# Patient Record
Sex: Female | Born: 1985 | Race: White | Hispanic: No | Marital: Single | Smoking: Never smoker
Health system: Southern US, Community
[De-identification: ages and names within clinical notes are randomized; demographics above are authoritative.]

## PROBLEM LIST (undated history)

## (undated) DIAGNOSIS — E669 Obesity, unspecified: Secondary | ICD-10-CM

## (undated) DIAGNOSIS — I160 Hypertensive urgency: Secondary | ICD-10-CM

## (undated) DIAGNOSIS — F419 Anxiety disorder, unspecified: Secondary | ICD-10-CM

## (undated) DIAGNOSIS — I517 Cardiomegaly: Secondary | ICD-10-CM

## (undated) DIAGNOSIS — K219 Gastro-esophageal reflux disease without esophagitis: Secondary | ICD-10-CM

## (undated) DIAGNOSIS — J329 Chronic sinusitis, unspecified: Secondary | ICD-10-CM

## (undated) HISTORY — DX: Gastro-esophageal reflux disease without esophagitis: K21.9

## (undated) HISTORY — DX: Chronic sinusitis, unspecified: J32.9

## (undated) HISTORY — DX: Obesity, unspecified: E66.9

## (undated) HISTORY — DX: Anxiety disorder, unspecified: F41.9

## (undated) HISTORY — DX: Hypertensive urgency: I16.0

## (undated) HISTORY — DX: Cardiomegaly: I51.7

---
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
## 2010-07-27 ENCOUNTER — Emergency Department (HOSPITAL_COMMUNITY)
Admission: EM | Admit: 2010-07-27 | Discharge: 2010-07-27 | Payer: Self-pay | Source: Home / Self Care | Admitting: Emergency Medicine

## 2013-08-27 HISTORY — PX: TUBAL LIGATION: SHX77

## 2013-10-16 ENCOUNTER — Other Ambulatory Visit (HOSPITAL_COMMUNITY): Payer: Self-pay | Admitting: Obstetrics and Gynecology

## 2013-10-16 ENCOUNTER — Encounter (HOSPITAL_COMMUNITY): Payer: Self-pay | Admitting: Obstetrics and Gynecology

## 2013-10-16 DIAGNOSIS — Z3689 Encounter for other specified antenatal screening: Secondary | ICD-10-CM

## 2013-10-16 DIAGNOSIS — O280 Abnormal hematological finding on antenatal screening of mother: Secondary | ICD-10-CM

## 2013-10-23 ENCOUNTER — Encounter (HOSPITAL_COMMUNITY): Payer: BC Managed Care – PPO

## 2013-10-23 ENCOUNTER — Ambulatory Visit (HOSPITAL_COMMUNITY): Admission: RE | Admit: 2013-10-23 | Payer: BC Managed Care – PPO | Source: Ambulatory Visit

## 2013-10-28 ENCOUNTER — Ambulatory Visit (HOSPITAL_COMMUNITY): Payer: BC Managed Care – PPO | Attending: Obstetrics and Gynecology

## 2013-10-28 ENCOUNTER — Ambulatory Visit (HOSPITAL_COMMUNITY): Admission: RE | Admit: 2013-10-28 | Payer: BC Managed Care – PPO | Source: Ambulatory Visit

## 2018-07-08 DIAGNOSIS — I361 Nonrheumatic tricuspid (valve) insufficiency: Secondary | ICD-10-CM

## 2018-07-08 DIAGNOSIS — R0602 Shortness of breath: Secondary | ICD-10-CM

## 2018-07-08 DIAGNOSIS — I5020 Unspecified systolic (congestive) heart failure: Secondary | ICD-10-CM

## 2018-07-08 DIAGNOSIS — J329 Chronic sinusitis, unspecified: Secondary | ICD-10-CM

## 2018-07-08 DIAGNOSIS — I10 Essential (primary) hypertension: Secondary | ICD-10-CM

## 2018-07-08 DIAGNOSIS — I5043 Acute on chronic combined systolic (congestive) and diastolic (congestive) heart failure: Secondary | ICD-10-CM

## 2018-07-14 ENCOUNTER — Telehealth: Payer: Self-pay

## 2018-07-14 NOTE — Telephone Encounter (Signed)
SENT REFERRAL TO SCHEDULING AND FILE NOTES 

## 2018-07-22 ENCOUNTER — Encounter: Payer: Self-pay | Admitting: Internal Medicine

## 2018-07-22 ENCOUNTER — Ambulatory Visit (INDEPENDENT_AMBULATORY_CARE_PROVIDER_SITE_OTHER): Payer: Self-pay | Admitting: Internal Medicine

## 2018-07-22 VITALS — BP 110/82 | HR 63 | Ht 66.0 in | Wt 277.0 lb

## 2018-07-22 DIAGNOSIS — I428 Other cardiomyopathies: Secondary | ICD-10-CM

## 2018-07-22 MED ORDER — SACUBITRIL-VALSARTAN 49-51 MG PO TABS: 1.0000 | ORAL_TABLET | Freq: Two times a day (BID) | ORAL | 11 refills | Status: DC

## 2018-07-22 NOTE — Progress Notes (Signed)
ELECTROPHYSIOLOGY OFFICE NOTE  Patient ID: Joy Diaz, MRN: [id number], DOB/AGE: May 13, 1986 32 y.o. Admit date: (Not on file) Date of Consult: 07/22/2018  Primary Physician: Patient, [name] Primary Cardiologist: new     HPI Joy Diaz is a 32 y.o. female seen at her own request.  She has a history of morbid obesity and her patient record describes bipolar disorder.  She has a history of allergic diathesis and has had recurrent sinus infections.  She noted problems again about a month ago further accompanied by dyspnea.  She had a little bit of peripheral edema.  She went to primary care where chest x-ray demonstrated cardiomegaly.  She was referred to [facility].  Echocardiogram demonstrated an ejection fraction of 15% with LVIDD of 6.1 severe left atrial enlargement at 50.4 mm right side was described as mildly enlarged but preserved RV function.  She underwent a nuclear medicine scan with a resting systolic blood pressure 162/100.  Nuclear images were obtained.  According to the family they were negative.  They are not available.  She has a history of blood pressure.  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  Over the years when she has been noted to be hypertensive after coming down apparently her blood pressures returned to normal suggesting whitecoat hypertension.  Not withstanding, the discharge summary from [facility] hospital describes hypertensive urgency.  Having been admitted to the hospital she was treated with IV Lasix and then started on carvedilol 12.5 valsartan 160 spironolactone.  The latter was discontinued because of vaginal bleeding.  She will try to begin following resolution with recurrence of bleeding.  She has poor understanding of her situation.  She gave birth to a son 4 years ago; no interval pregnancies.  No recent viral illnesses.  Troponins x1 were normal at [facility]       Past Medical History:  Diagnosis  Date  . Anxiety   . Enlarged heart   . GERD (gastroesophageal reflux disease)   . Hypertensive urgency   . Obese   . Sinusitis       Surgical History:  Past Surgical History:  Procedure Laterality Date  . MANDIBLE SURGERY Bilateral 2006  . TUBAL LIGATION Bilateral 2015     Home Meds: Current Meds  Medication Sig  . aspirin EC 81 MG tablet Take 81 mg by mouth daily.  . carvedilol (COREG) 25 MG tablet Take 25 mg by mouth 2 (two) times daily with a meal.  . furosemide (LASIX) 40 MG tablet Take 40 mg by mouth.  Marland Kitchen omeprazole (PRILOSEC) 10 MG capsule Take 10 mg by mouth daily.  Marland Kitchen spironolactone (ALDACTONE) 25 MG tablet Take 25 mg by mouth daily.  . valsartan (DIOVAN) 160 MG tablet Take 160 mg by mouth daily.    Allergies: No Known Allergies  Social History   Socioeconomic History  . Marital status: Single    Spouse name: Not on file  . Number of children: Not on file  . Years of education: Not on file  . Highest education level: Not on file  Occupational History  . Not on file  Social Needs  . Financial resource strain: Not on file  . Food insecurity:    Worry: Not on file    Inability: Not on file  . Transportation needs:    Medical: Not on file    Non-medical: Not on file  Tobacco Use  . Smoking status: Never Smoker  . Smokeless  tobacco: Never Used  Substance and Sexual Activity  . Alcohol use: Never    Frequency: Never  . Drug use: Never  . Sexual activity: Not on file  Lifestyle  . Physical activity:    Days per week: Not on file    Minutes per session: Not on file  . Stress: Not on file  Relationships  . Social connections:    Talks on phone: Not on file    Gets together: Not on file    Attends religious service: Not on file    Active member of club or organization: Not on file    Attends meetings of clubs or organizations: Not on file    Relationship status: Not on file  . Intimate partner violence:    Fear of current or ex partner: Not on file     Emotionally abused: Not on file    Physically abused: Not on file    Forced sexual activity: Not on file  Other Topics Concern  . Not on file  Social History Narrative  . Not on file     Family History  Problem Relation Age of Onset  . CAD Paternal Grandfather   . Asthma Mother   . Migraines Mother   . Rheum arthritis Mother   . Ulcerative colitis Sister   . Irritable bowel syndrome Brother   . Celiac disease Maternal Grandmother   . Osteopenia Maternal Grandmother   . Heart disease Maternal Grandfather      ROS:  Please see the history of present illness.     All other systems reviewed and negative.    Physical Exam:  Blood pressure 110/82, pulse 63, height 5\' 6"  (1.676 m), weight 277 lb (125.6 kg), SpO2 97 %. General: Well developed, Morbidly obese  female in no acute distress. Head: Normocephalic, atraumatic, sclera non-icteric, no xanthomas, nares are without discharge. EENT: normal  Lymph Nodes:  none Neck: Negative for carotid bruits. JVD 8 Back:without scoliosis kyphosis Lungs: Clear bilaterally to auscultation without wheezes, rales, or rhonchi. Breathing is unlabored. Heart: RRR with S1 S2. No murmur . No rubs, or gallops appreciated. Abdomen: Soft, non-tender, non-distended with normoactive bowel sounds. No hepatomegaly. No rebound/guarding. No obvious abdominal masses. Msk:  Strength and tone appear normal for age. Extremities: No clubbing or cyanosis. TR edema.  Distal pedal pulses are 2+ and equal bilaterally. Skin: Warm and Dry Neuro: Alert and oriented X 3. CN III-XII intact Grossly normal sensory and motor function . Psych:  Responds to questions appropriately with a normal affect.      Labs: Cardiac Enzymes No results for input(s): CKTOTAL, CKMB, TROPONINI in the last 72 hours. CBC No results found for: WBC, HGB, HCT, MCV, PLT PROTIME: No results for input(s): LABPROT, INR in the last 72 hours. Chemistry No results for input(s): NA, K, CL, CO2, BUN,  CREATININE, CALCIUM, PROT, BILITOT, ALKPHOS, ALT, AST, GLUCOSE in the last 168 hours.  Invalid input(s): LABALBU Lipids No results found for: CHOL, HDL, LDLCALC, TRIG BNP No results found for: PROBNP Thyroid Function Tests: No results for input(s): TSH, T4TOTAL, T3FREE, THYROIDAB in the last 72 hours.  Invalid input(s): FREET3 Miscellaneous No results found for: DDIMER  Radiology/Studies:  No results found.  EKG: ECG from November 12  12: 26 hours sinus rhythm at 102 Intervals 18/09/36 2201 sinus rhythm at 92 intervals 20/11/42 Notably P wave morphology is similar in both.  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  Her symptoms are surprisingly few.  I do not think that her rhythm is responsible.  It looks too much like sinus rhythm.  No antecedent symptoms to suggest myocarditis; her troponin was also normal.  However, imaging is going to be essential.  We will plan to repeat the echo at the time of heart failure clinic appointment and will schedule her for a cardiac MRI.  We will decrease her carvedilol given her lightheadedness from 25--12.5.  We will discontinue her valsartan and put her on Entresto 49/51.  It looks like she will be intolerant of aldosterone antagonists because of the vaginal bleeding.  We have reviewed the physiology and the potential implications of her severe LV dysfunction. I have reviewed the above plan with Dr. Sherlie Ban.  He is in agreement.  We will obtain a repeat echo       Sherryl Manges

## 2018-07-22 NOTE — Patient Instructions (Addendum)
Medication Instructions:  Your physician has recommended you make the following change in your medication:   1. Stop Valsartan 2. Stop Spironolactone 3. Decrease your Carvedilol to 12.5mg , half tablet, two times per day 4. Begin Entresto 49/51 tablet, two times per day   Labwork: Your physician recommends that you return for lab work in:   2 weeks for a BMP   Testing/Procedures: Your physician has requested that you have a cardiac MRI. Cardiac MRI uses a computer to create images of your heart as its beating, producing both still and moving pictures of your heart and major blood vessels. For further information please visit [email or web address]. Please follow the instruction sheet given to you today for more information.   Follow-Up: Your physician recommends that you schedule a follow-up appointment in:   We have sent in a referral to Dr Shirlee Latch in our Heart Failure Clinic  You will follow up with Dr Graciela Husbands as needed   Any Other Special Instructions Will Be Listed Below (If Applicable).     If you need a refill on your cardiac medications before your next appointment, please call your pharmacy.

## 2018-07-28 ENCOUNTER — Other Ambulatory Visit: Payer: Self-pay | Admitting: *Deleted

## 2018-07-28 ENCOUNTER — Other Ambulatory Visit (HOSPITAL_COMMUNITY): Payer: Self-pay | Admitting: *Deleted

## 2018-07-28 DIAGNOSIS — I5022 Chronic systolic (congestive) heart failure: Secondary | ICD-10-CM

## 2018-07-28 MED ORDER — CARVEDILOL 25 MG PO TABS: 12.5000 mg | ORAL_TABLET | Freq: Two times a day (BID) | ORAL | 3 refills | Status: DC

## 2018-07-29 ENCOUNTER — Telehealth (HOSPITAL_COMMUNITY): Payer: Self-pay | Admitting: Licensed Clinical Social Worker

## 2018-07-29 NOTE — Telephone Encounter (Signed)
CSW received referral to assist patient with financial resources and navigating the healthcare system for insurance options. CSW contacted patient who reports she and her child reside with a roommate. She reports she gave her last check from employment to her roommate to cover the rent expenses and "that is about to run out". She reports she has food stamps and due to a custody agreement where she agreed to not accept child support due to joint custody of the child she is not eligible to apply for Medicaid.  CSW discussed options from Social Security Disability to [employer]. Patient will follow up with [employer] and attempt to obtain an appointment for application. Patient will be seen in the HF Clinic on Friday August 01, 2018 and CSW will follow up at that time. Patient verbalizes understanding and will see CSW on Friday.  Joy Beech, LCSW, CCSW-MCS [phone]

## 2018-07-30 ENCOUNTER — Telehealth: Payer: Self-pay

## 2018-07-30 NOTE — Telephone Encounter (Signed)
The pt left her [employer] Pt Asst application here yesterday. I have completed the provider part of the application and placed it in Dr Koren Bound mail bin awaiting his signature.

## 2018-07-31 NOTE — Progress Notes (Signed)
Advanced Heart Failure Clinic Note   Referring Physician: PCP: Patient, [name] PCP-Cardiologist: No primary care provider on file.   HPI:  Joy Diaz is a 32 y.o. female with chronic systolic CHF, morbid obesity, anxiety, cardiomegaly, HTN (with ? Component of white coat hypertension), and GERD.   Seen at [facility] hospital earlier this year and Echo at that time shoed EF 15% with LVIDD 6.1, severe left atrial enlargement, and preserved RV function.  NM scan done at that time with resting BP of 162/100. Per family, scan was negative. They are not available for review.   Seen by Dr. Graciela Husbands 07/22/18 after complaints of dyspnea and peripheral edema. Transitioned to [facility] and discussed with Dr. Shirlee Latch who recommended referral to [facility] clinic.   Echo today shows EF ~ 35% per Dr. Shirlee Latch. Significantly improved from reports from [place]. Formal read pending.  Review of systems complete and found to be negative unless listed in HPI.    Past Medical History 1. Chronic systolic CHF - Echo today shows EF ~ 35% per Dr. Shirlee Latch. Significantly improved from reports from [place] of EF 15%.(though formal read) - NM scan at [facility] negative. Presumed NICM based on this.  - Will order cMRI to look for infiltrative CMP vs myocarditis.  2. HTN 3. H/o orthostasis 4. Morbid obesity - Body mass index is 44.84 kg/m.  - Encouraged weight loss through diet and exercise.   Past Medical History:  Diagnosis Date  . Anxiety   . Enlarged heart   . GERD (gastroesophageal reflux disease)   . Hypertensive urgency   . Obese   . Sinusitis     Current Outpatient Medications  Medication Sig Dispense Refill  . carvedilol (COREG) 25 MG tablet Take 0.5 tablets (12.5 mg total) by mouth 2 (two) times daily with a meal. 90 tablet 3  . furosemide (LASIX) 40 MG tablet Take 40 mg by mouth.    . sacubitril-valsartan (ENTRESTO) 49-51 MG Take 1 tablet by mouth 2 (two) times daily. 60 tablet 11  .  omeprazole (PRILOSEC) 10 MG capsule Take 10 mg by mouth daily.     No current facility-administered medications for this encounter.    Allergies  Allergen Reactions  . Spironolactone Other (See Comments)    Vaginal bleeding    Social History   Socioeconomic History  . Marital status: Single    Spouse name: Not on file  . Number of children: Not on file  . Years of education: Not on file  . Highest education level: Not on file  Occupational History  . Not on file  Social Needs  . Financial resource strain: Not on file  . Food insecurity:    Worry: Not on file    Inability: Not on file  . Transportation needs:    Medical: Not on file    Non-medical: Not on file  Tobacco Use  . Smoking status: Never Smoker  . Smokeless tobacco: Never Used  Substance and Sexual Activity  . Alcohol use: Never    Frequency: Never  . Drug use: Never  . Sexual activity: Not on file  Lifestyle  . Physical activity:    Days per week: Not on file    Minutes per session: Not on file  . Stress: Not on file  Relationships  . Social connections:    Talks on phone: Not on file    Gets together: Not on file    Attends religious service: Not on file    Active  member of club or organization: Not on file    Attends meetings of clubs or organizations: Not on file    Relationship status: Not on file  . Intimate partner violence:    Fear of current or ex partner: Not on file    Emotionally abused: Not on file    Physically abused: Not on file    Forced sexual activity: Not on file  Other Topics Concern  . Not on file  Social History Narrative  . Not on file      Family History  Problem Relation Age of Onset  . CAD Paternal Grandfather   . Asthma Mother   . Migraines Mother   . Rheum arthritis Mother   . Ulcerative colitis Sister   . Irritable bowel syndrome Brother   . Celiac disease Maternal Grandmother   . Osteopenia Maternal Grandmother   . Heart disease Maternal Grandfather      Vitals:   08/01/18 1104  BP: 100/70  Pulse: 68  SpO2: 97%  Weight: 126 kg (277 lb 12.8 oz)    Wt Readings from Last 3 Encounters:  08/01/18 126 kg (277 lb 12.8 oz)  07/22/18 125.6 kg (277 lb)    PHYSICAL EXAM: General:  Well appearing. No respiratory difficulty HEENT: normal Neck: supple. no JVD. Carotids 2+ bilat; no bruits. No lymphadenopathy or thyromegaly appreciated. Cor: PMI nondisplaced. Regular rate & rhythm. No rubs, gallops or murmurs. Lungs: clear Abdomen: Obese, soft, nontender, nondistended. No hepatosplenomegaly. No bruits or masses. Good bowel sounds. Extremities: no cyanosis, clubbing, rash, edema Neuro: alert & oriented x 3, cranial nerves grossly intact. moves all 4 extremities w/o difficulty. Affect pleasant.  ECG: NSR 71 bpm, personally reviewed.  ASSESSMENT & PLAN:  1. Chronic systolic CHF - Echo today shows EF ~ 35% per Dr. Shirlee Latch. Significantly improved from reports from [place]. Formal read pending. - Will order cMRI to look for infiltrative CMP vs myocarditis. SW to see today to help with finances - NYHA II symptoms currently. - Volume status stable to dry with mild orthostatics. - Cut lasix back to 40 mg Monday and Friday. Can take additional as needed. BMET today.  - Continue Entresto 49/51 mg BID - Continue coreg 12.5 mg BID - Reinforced fluid restriction to < 2 L daily, sodium restriction to less than 2000 mg daily, and the importance of daily weights.   2. HTN - Will adjust meds in setting of treating her CHF. Likely large contributor to her CHF. No room to up-titrate meds today.  3. H/o orthostasis - Mild orthostasis today. Cutting back lasix as above.  4. Morbid obesity - Body mass index is 44.84 kg/m. - Encouraged weight loss through diet and exercise.   Will schedule cMRI and have SW see to if she can manage to pay for, or if we can get her in [facility] discount program.   Graciella Freer, PA-C 08/01/18   Greater than 50% of  the 30 minute visit was spent in counseling/coordination of care regarding disease state education, salt/fluid restriction, sliding scale diuretics, and medication compliance.

## 2018-08-01 ENCOUNTER — Other Ambulatory Visit: Payer: Self-pay

## 2018-08-01 ENCOUNTER — Encounter (HOSPITAL_COMMUNITY): Payer: Self-pay

## 2018-08-01 ENCOUNTER — Ambulatory Visit (HOSPITAL_BASED_OUTPATIENT_CLINIC_OR_DEPARTMENT_OTHER)
Admission: RE | Admit: 2018-08-01 | Discharge: 2018-08-01 | Disposition: A | Discharge status: Home / Self Care | Payer: Self-pay | Source: Ambulatory Visit

## 2018-08-01 ENCOUNTER — Ambulatory Visit (HOSPITAL_COMMUNITY)
Admission: RE | Admit: 2018-08-01 | Discharge: 2018-08-01 | Disposition: A | Discharge status: Home / Self Care | Payer: Self-pay | Source: Ambulatory Visit | Attending: Internal Medicine | Admitting: Internal Medicine

## 2018-08-01 VITALS — BP 100/70 | HR 68 | Wt 277.8 lb

## 2018-08-01 DIAGNOSIS — Z79899 Other long term (current) drug therapy: Secondary | ICD-10-CM | POA: Insufficient documentation

## 2018-08-01 DIAGNOSIS — I10 Essential (primary) hypertension: Secondary | ICD-10-CM

## 2018-08-01 DIAGNOSIS — K219 Gastro-esophageal reflux disease without esophagitis: Secondary | ICD-10-CM | POA: Insufficient documentation

## 2018-08-01 DIAGNOSIS — I371 Nonrheumatic pulmonary valve insufficiency: Secondary | ICD-10-CM | POA: Insufficient documentation

## 2018-08-01 DIAGNOSIS — Z888 Allergy status to other drugs, medicaments and biological substances status: Secondary | ICD-10-CM | POA: Insufficient documentation

## 2018-08-01 DIAGNOSIS — I5022 Chronic systolic (congestive) heart failure: Secondary | ICD-10-CM

## 2018-08-01 DIAGNOSIS — I110 Hypertensive heart disease with heart failure: Secondary | ICD-10-CM | POA: Insufficient documentation

## 2018-08-01 DIAGNOSIS — I951 Orthostatic hypotension: Secondary | ICD-10-CM

## 2018-08-01 DIAGNOSIS — Z6841 Body Mass Index (BMI) 40.0 and over, adult: Secondary | ICD-10-CM | POA: Insufficient documentation

## 2018-08-01 DIAGNOSIS — Z8249 Family history of ischemic heart disease and other diseases of the circulatory system: Secondary | ICD-10-CM | POA: Insufficient documentation

## 2018-08-01 LAB — BASIC METABOLIC PANEL
ANION GAP: 10 (ref 5–15)
BUN: 14 mg/dL (ref 6–20)
CHLORIDE: 101 mmol/L (ref 98–111)
CO2: 26 mmol/L (ref 22–32)
CREATININE: 1.44 mg/dL — AB (ref 0.44–1.00)
Calcium: 9.5 mg/dL (ref 8.9–10.3)
GFR calc non Af Amer: 48 mL/min — ABNORMAL LOW (ref >60)
GFR, EST AFRICAN AMERICAN: 56 mL/min — AB (ref >60)
Glucose, Bld: 119 mg/dL — ABNORMAL HIGH (ref 70–99)
Potassium: 4.4 mmol/L (ref 3.5–5.1)
Sodium: 137 mmol/L (ref 135–145)

## 2018-08-01 MED ORDER — FUROSEMIDE 40 MG PO TABS: ORAL_TABLET | ORAL | 2 refills | Status: DC

## 2018-08-01 NOTE — Patient Instructions (Signed)
Labs done today  Your physician has requested that you have a cardiac MRI. Cardiac MRI uses a computer to create images of your heart as its beating, producing both still and moving pictures of your heart and major blood vessels. For further information please visit [email or web address]. Please follow the instruction sheet given to you today for more information.  DECREASE Furosemide to 40mg  (1 tab) Mondays and Fridays. You may take an additional 1 tab as needed.  Follow up with Dr. Shirlee Latch in 3-4 weeks.

## 2018-08-01 NOTE — Addendum Note (Signed)
Encounter addended by: Marcy Siren, LCSW on: 08/01/2018 3:08 PM  Actions taken: Sign clinical note

## 2018-08-01 NOTE — Progress Notes (Signed)
CSW met with patient to discuss options for insurance. Patient has no income and no insurance. She thinks she may have applied for the [facility] but is unsure. CSW discussed follow up with medicaid office for possible application for disability medicaid or orange card eligibility. Patient provided numbers for Financial Counseling as well as [employer] [facility] for PCP/Financial Counseling as well. Patient grateful for the assistance and will return call if further needs arise. Raquel Sarna, [place], [place]

## 2018-08-01 NOTE — Progress Notes (Signed)
  Echocardiogram 2D Echocardiogram has been performed.  Joy Diaz 08/01/2018, 11:06 AM

## 2018-08-04 NOTE — Telephone Encounter (Signed)
**Note De-identified Joy Diaz Obfuscation** Dr Klein has signed the pts [employer] pt asst application and I have faxed it to [employer]. 

## 2018-08-12 NOTE — Telephone Encounter (Signed)
Received approval letter from [employer] stating that pt's Sherryll Burger was approved through 08/10/2019 with no out of pocket cost!

## 2018-09-05 ENCOUNTER — Ambulatory Visit (HOSPITAL_COMMUNITY)
Admission: RE | Admit: 2018-09-05 | Discharge: 2018-09-05 | Disposition: A | Discharge status: Home / Self Care | Payer: Self-pay | Source: Ambulatory Visit | Attending: Cardiology | Admitting: Cardiology

## 2018-09-05 ENCOUNTER — Encounter (HOSPITAL_COMMUNITY): Payer: Self-pay | Admitting: Cardiology

## 2018-09-05 VITALS — BP 126/86 | HR 82 | Wt 274.2 lb

## 2018-09-05 DIAGNOSIS — Z79899 Other long term (current) drug therapy: Secondary | ICD-10-CM | POA: Insufficient documentation

## 2018-09-05 DIAGNOSIS — E669 Obesity, unspecified: Secondary | ICD-10-CM | POA: Insufficient documentation

## 2018-09-05 DIAGNOSIS — I429 Cardiomyopathy, unspecified: Secondary | ICD-10-CM

## 2018-09-05 DIAGNOSIS — R05 Cough: Secondary | ICD-10-CM | POA: Insufficient documentation

## 2018-09-05 DIAGNOSIS — R0600 Dyspnea, unspecified: Secondary | ICD-10-CM | POA: Insufficient documentation

## 2018-09-05 DIAGNOSIS — R0981 Nasal congestion: Secondary | ICD-10-CM | POA: Insufficient documentation

## 2018-09-05 DIAGNOSIS — I517 Cardiomegaly: Secondary | ICD-10-CM | POA: Insufficient documentation

## 2018-09-05 DIAGNOSIS — I514 Myocarditis, unspecified: Secondary | ICD-10-CM

## 2018-09-05 DIAGNOSIS — I10 Essential (primary) hypertension: Secondary | ICD-10-CM

## 2018-09-05 DIAGNOSIS — I5022 Chronic systolic (congestive) heart failure: Secondary | ICD-10-CM

## 2018-09-05 LAB — BASIC METABOLIC PANEL
Anion gap: 6 (ref 5–15)
BUN: 10 mg/dL (ref 6–20)
CO2: 29 mmol/L (ref 22–32)
CREATININE: 0.97 mg/dL (ref 0.44–1.00)
Calcium: 9.6 mg/dL (ref 8.9–10.3)
Chloride: 104 mmol/L (ref 98–111)
GFR calc Af Amer: >60 mL/min (ref >60)
GLUCOSE: 118 mg/dL — AB (ref 70–99)
Potassium: 3.8 mmol/L (ref 3.5–5.1)
SODIUM: 139 mmol/L (ref 135–145)

## 2018-09-05 MED ORDER — SACUBITRIL-VALSARTAN 97-103 MG PO TABS: 1.0000 | ORAL_TABLET | Freq: Two times a day (BID) | ORAL | 3 refills | Status: DC

## 2018-09-05 NOTE — Progress Notes (Signed)
CSW consulted to assist patient in getting insurance coverage.  CSW spoke with pt who states she has applied for orange card and for cone assistance.  Pt reports that she was denied for orange card since she is not a [place] resident and that cone will not move forward with her application till she gives them proof of residence which she can't obtain because does not possess the utilities or lease and doesn't have the money to change her driver license to reflect the correct address.  Pt also unable to provide financial documentation due to lack of income over past months.  CSW completed 4506-t non-filing request form to get proof of pt inability to file for taxes last year for her financial documentation and pt to obtain letter of award for food stamps.    CSW to look into options to prove residency and will follow up with pt on Tuesday.   CSW will continue to follow and assist as able  Burna Sis, LCSW Clinical Social Worker Advanced Heart Failure Clinic [phone]

## 2018-09-05 NOTE — Patient Instructions (Signed)
INCREASE Entresto to 97/103mg  twice daily.  Routine lab work today. Will notify you of abnormal results  Repeat labs in 10days at [facility].  Your provider requests you have a Cardiac MRI and a Coronary CTA. (they will contact you to schedule appointments)  Follow up in 3 weeks with pharmacy.  Follow up in 6 weeks with Dr.McLean.

## 2018-09-08 ENCOUNTER — Telehealth (HOSPITAL_COMMUNITY): Payer: Self-pay | Admitting: *Deleted

## 2018-09-08 NOTE — Telephone Encounter (Signed)
Patient does not qualify for the orange card and does not have insurance. Patient said she can not afford to have cardiac CT or CMRI.

## 2018-09-08 NOTE — Progress Notes (Signed)
PCP: Joy Diaz, [name]  Cardiology: Dr. Graciela Husbands HF Cardiology: Dr. Shirlee Latch  32 with cardiomyopathy referred by Dr. Graciela Husbands for evaluation of CHF.   In 10/19, she developed dyspnea.  She thought she had another episode of sinusitis initially (has had episodes repetitively in the past).  She had congestion, cough, and dyspnea. No chest pain. She had a CXR done that showed cardiomegaly.  Therefore, in 11/19, she had an echo done in [place] showing EF 15%, mild LV dilation, normal RV.  She was started on cardiac medications with cardiomyopathy.  She had vaginal bleeding twice with spironolactone so stopped it.  She is tolerating Coreg and Entresto.  Repeat echo in 12/19 showed EF 30-35%, mild LVH.    Currently, feeling better with treatment. She is short of breath only with heavy exertion.  No problems doing work around the house.  No dyspnea walking on flat ground or up 1 flight stairs.  No orthopnea/PND.  No lightheadedness/syncope.   ECG (12/19): NSR, normal (personally reviewed).   Labs (12/19): K 4.4, creatinine 1.44  PMH: 1. Obesity 2. Bipolar disorder 3. Recurrent sinusitis 4. HTN: Also with pre-eclampsia history.  5. Chronic systolic CHF:  - Echo (11/19, [place]): EF 15%, mild LV dilation, severe LAE, normal RV size and systolic function.  - Echo (12/19): EF 30-35%, mild LVH, moderate diastolic dysfunction.   SH: Married, lives in [place], [place] schools son, 1 son.  Rare ETOH, nonsmoker, no drugs.   Family History  Problem Relation Age of Onset  . CAD Paternal Grandfather   . Asthma Mother   . Migraines Mother   . Rheum arthritis Mother   . Ulcerative colitis Sister   . Irritable bowel syndrome Brother   . Celiac disease Maternal Grandmother   . Osteopenia Maternal Grandmother   . Heart disease Maternal Grandfather    ROS: All systems reviewed and negative except as per HPI.   Current Outpatient Medications  Medication Sig Dispense Refill  . carvedilol (COREG) 25  MG tablet Take 0.5 tablets (12.5 mg total) by mouth 2 (two) times daily with a meal. 90 tablet 3  . furosemide (LASIX) 40 MG tablet 1 tab (40 mg) on mondays and fridays. Take additional 1 tab as needed. 20 tablet 2  . omeprazole (PRILOSEC) 10 MG capsule Take 40 mg by mouth daily.     . sacubitril-valsartan (ENTRESTO) 97-103 MG Take 1 tablet by mouth 2 (two) times daily. 60 tablet 3   No current facility-administered medications for this encounter.    BP 126/86   Pulse 82   Wt 124.4 kg (274 lb 3.2 oz)   SpO2 98%   BMI 44.26 kg/m  General: NAD Neck: No JVD, no thyromegaly or thyroid nodule.  Lungs: Clear to auscultation bilaterally with normal respiratory effort. CV: Nondisplaced PMI.  Heart regular S1/S2, no S3/S4, no murmur.  No peripheral edema.  No carotid bruit.  Normal pedal pulses.  Abdomen: Soft, nontender, no hepatosplenomegaly, no distention.  Skin: Intact without lesions or rashes.  Neurologic: Alert and oriented x 3.  Psych: Normal affect. Extremities: No clubbing or cyanosis.  HEENT: Normal.   Assessment/Plan: 1. Chronic systolic CHF: Echo in 10/19 with EF 15%, improved to 30-35% on echo in 12/19. No chest pain.  It is possible that her cardiomyopathy is due to viral myocarditis, especially given onset that seemed like a sinusitis episode. No strong family history of cardiomyopathy, no heavy ETOH/drugs.  She appears to have had some improvement by echo.  On  exam, she is not volume overloaded.  NYHA class II symptoms.  - Continue Coreg.  - Increase Entresto to 97/103 bid.  BMET today and in 10 days.  - Will hold off on spironolactone for now given recurrent vaginal bleeding while taking it.  - She can continue Lasix twice a week.  - Coronary CT angiogram to rule out CAD (unlikely).  - Cardiac MRI to assess for infiltrative disease/myocarditis. We will need to work on getting her an [place] Card to do the MRI.  2. HTN: BP controlled. 3. Joy Diaz has no insurance, will have  social worker help her with getting orange card.   Followup 3 wks in pharmacy clinic for medication titration and 6 wks with me.    Marca Ancona 09/08/2018

## 2018-09-09 ENCOUNTER — Telehealth (HOSPITAL_COMMUNITY): Payer: Self-pay | Admitting: Licensed Clinical Social Worker

## 2018-09-09 NOTE — Telephone Encounter (Signed)
CSW called to check with pt about getting required documents for [facility] Assistance.  Pt informed that she needs 90 days of savings account original statements, letter of support notarized, and letter of benefits for her food stamps.  Pt has bank account with [employer] and will plan to get statements from them- CSW suggested she inquire if they have a notary on staff in order to get letter of support notarized.  CSW will continue to follow and assist as needed  Burna Sis, LCSW Clinical Social Worker [facility] [phone]

## 2018-09-16 ENCOUNTER — Encounter: Payer: Self-pay | Admitting: *Deleted

## 2018-09-16 ENCOUNTER — Telehealth: Payer: Self-pay | Admitting: *Deleted

## 2018-09-16 NOTE — Telephone Encounter (Signed)
Left message for patient to call regarding appointment for Cardiac MRI scheduled for 09/25/18 at 3:00pm---Information also mailed to patient

## 2018-09-23 ENCOUNTER — Telehealth (HOSPITAL_COMMUNITY): Payer: Self-pay | Admitting: Emergency Medicine

## 2018-09-23 NOTE — Telephone Encounter (Signed)
Reaching out to patient to offer assistance regarding upcoming cardiac imaging study; pt verbalizes understanding of appt date/time, parking situation and where to check in, and verified current allergies; name and call back number provided for further questions should they arise Rockwell Alexandria RN Navigator Cardiac Imaging [facility] [phone] office [phone] cell   Pt instructed to remove all piercings Aware she will be in machine for ~1 hr Concerned about comfort as she has scoliosis

## 2018-09-25 ENCOUNTER — Ambulatory Visit (HOSPITAL_COMMUNITY): Payer: Self-pay

## 2018-10-06 ENCOUNTER — Ambulatory Visit (HOSPITAL_COMMUNITY): Payer: Self-pay

## 2018-10-20 ENCOUNTER — Telehealth (HOSPITAL_COMMUNITY): Payer: Self-pay | Admitting: Emergency Medicine

## 2018-10-20 NOTE — Telephone Encounter (Signed)
Pt stated "this is the first I have heard about an MRI being scheduled"; pt requesting to reschedule as she has to figure out child care. Message given to Charla to call patient back   Rockwell Alexandria RN Navigator Cardiac Imaging [facility] [phone] Office  [phone] Cell

## 2018-10-21 ENCOUNTER — Ambulatory Visit (HOSPITAL_COMMUNITY): Admission: RE | Admit: 2018-10-21 | Payer: Self-pay | Source: Ambulatory Visit

## 2018-10-24 ENCOUNTER — Ambulatory Visit (HOSPITAL_COMMUNITY)
Admission: RE | Admit: 2018-10-24 | Discharge: 2018-10-24 | Disposition: A | Discharge status: Home / Self Care | Payer: Self-pay | Source: Ambulatory Visit | Attending: Cardiology | Admitting: Cardiology

## 2018-10-24 VITALS — BP 110/65 | HR 75 | Wt 267.8 lb

## 2018-10-24 DIAGNOSIS — Z6841 Body Mass Index (BMI) 40.0 and over, adult: Secondary | ICD-10-CM | POA: Insufficient documentation

## 2018-10-24 DIAGNOSIS — I5022 Chronic systolic (congestive) heart failure: Secondary | ICD-10-CM | POA: Insufficient documentation

## 2018-10-24 DIAGNOSIS — E669 Obesity, unspecified: Secondary | ICD-10-CM | POA: Insufficient documentation

## 2018-10-24 DIAGNOSIS — F319 Bipolar disorder, unspecified: Secondary | ICD-10-CM | POA: Insufficient documentation

## 2018-10-24 DIAGNOSIS — Z8249 Family history of ischemic heart disease and other diseases of the circulatory system: Secondary | ICD-10-CM | POA: Insufficient documentation

## 2018-10-24 DIAGNOSIS — I110 Hypertensive heart disease with heart failure: Secondary | ICD-10-CM | POA: Insufficient documentation

## 2018-10-24 DIAGNOSIS — Z79899 Other long term (current) drug therapy: Secondary | ICD-10-CM | POA: Insufficient documentation

## 2018-10-24 LAB — BASIC METABOLIC PANEL
ANION GAP: 8 (ref 5–15)
BUN: 9 mg/dL (ref 6–20)
CHLORIDE: 103 mmol/L (ref 98–111)
CO2: 24 mmol/L (ref 22–32)
Calcium: 9.8 mg/dL (ref 8.9–10.3)
Creatinine, Ser: 0.96 mg/dL (ref 0.44–1.00)
GFR calc Af Amer: >60 mL/min (ref >60)
GFR calc non Af Amer: >60 mL/min (ref >60)
Glucose, Bld: 95 mg/dL (ref 70–99)
POTASSIUM: 3.7 mmol/L (ref 3.5–5.1)
SODIUM: 135 mmol/L (ref 135–145)

## 2018-10-24 MED ORDER — SACUBITRIL-VALSARTAN 97-103 MG PO TABS: 1.0000 | ORAL_TABLET | Freq: Two times a day (BID) | ORAL | 11 refills | Status: DC

## 2018-10-24 MED ORDER — SACUBITRIL-VALSARTAN 97-103 MG PO TABS: 1.0000 | ORAL_TABLET | Freq: Two times a day (BID) | ORAL | 6 refills | Status: DC

## 2018-10-24 NOTE — Patient Instructions (Addendum)
INCREASE Entresto to 97/103mg  (1 tab) twice a day  Labs today and repeat in 10 days We will only contact you if something comes back abnormal or we need to make some changes. Otherwise no news is good news!  You will be scheduled for your Coronary CT today, they will call you to give you instructions to prepare for this test.   Your physician recommends that you schedule a follow-up appointment in: 2 months with Dr. Shirlee Latch

## 2018-10-24 NOTE — Progress Notes (Signed)
CSW consulted to assist with insurance concerns and medication adjustments.  Pt Entresto dose changing but gets it through [employer]- CSW [employer] who confirms we just need to send in updated prescription- script faxed in and confirmation received  CSW also spoke with pt about progress with [employer]- pt reports she was able to get food stamp verification and received non-filing status paperwork in the mail that CSW requested at last appt but has not gotten any of the other paperwork we discussed.  Pt will plan to go to bank and get statements and will be going to [facility] to renew license with updated address- confirms she should have sufficient funds at this time to do so.  CSW will continue to follow and assist as needed  Burna Sis, LCSW Clinical Social Worker [facility] [phone]

## 2018-10-26 NOTE — Progress Notes (Signed)
PCP: Patient, [name]  Cardiology: Dr. Graciela Husbands HF Cardiology: Dr. Shirlee Latch  32 with cardiomyopathy referred by Dr. Graciela Husbands for evaluation of CHF.   In 10/19, she developed dyspnea.  She thought she had another episode of sinusitis initially (has had episodes repetitively in the past).  She had congestion, cough, and dyspnea. No chest pain. She had a CXR done that showed cardiomegaly.  Therefore, in 11/19, she had an echo done in [place] showing EF 15%, mild LV dilation, normal RV.  She was started on cardiac medications with cardiomyopathy.  She had vaginal bleeding twice with spironolactone so stopped it.  She is tolerating Coreg and Entresto.  Repeat echo in 12/19 showed EF 30-35%, mild LVH.    She returns for followup of CHF.  She did not increase Entresto after last appointment, still taking 49/51 bid.  No significant exertional dyspnea at this point.  No orthopnea/PND. She had 2 episodes of chest pain with no trigger, both lasting about 1-2 hours, in the last 3-4 weeks.  She is still trying to get on the [place] patient assistance program and has not had cardiac MRI or coronary CTA yet. Weight is down 7 lbs.    Labs (12/19): K 4.4, creatinine 1.44 Labs (1/20): K 3.8, creatinine 0.97  PMH: 1. Obesity 2. Bipolar disorder 3. Recurrent sinusitis 4. HTN: Also with pre-eclampsia history.  5. Chronic systolic CHF:  - Echo (11/19, ): EF 15%, mild LV dilation, severe LAE, normal RV size and systolic function.  - Echo (12/19): EF 30-35%, mild LVH, moderate diastolic dysfunction.   SH: Married, lives in [place], [place] schools son, 1 son.  Rare ETOH, nonsmoker, no drugs.   Family History  Problem Relation Age of Onset  . CAD Paternal Grandfather   . Asthma Mother   . Migraines Mother   . Rheum arthritis Mother   . Ulcerative colitis Sister   . Irritable bowel syndrome Brother   . Celiac disease Maternal Grandmother   . Osteopenia Maternal Grandmother   . Heart disease  Maternal Grandfather    ROS: All systems reviewed and negative except as per HPI.   Current Outpatient Medications  Medication Sig Dispense Refill  . carvedilol (COREG) 25 MG tablet Take 0.5 tablets (12.5 mg total) by mouth 2 (two) times daily with a meal. 90 tablet 3  . furosemide (LASIX) 40 MG tablet 1 tab (40 mg) on mondays and fridays. Take additional 1 tab as needed. 20 tablet 2  . omeprazole (PRILOSEC) 10 MG capsule Take 40 mg by mouth daily.     . sacubitril-valsartan (ENTRESTO) 97-103 MG Take 1 tablet by mouth 2 (two) times daily. 60 tablet 11   No current facility-administered medications for this encounter.    BP 110/65   Pulse 75   Wt 121.5 kg (267 lb 12.8 oz)   SpO2 96%   BMI 43.22 kg/m  General: NAD Neck: No JVD, no thyromegaly or thyroid nodule.  Lungs: Clear to auscultation bilaterally with normal respiratory effort. CV: Nondisplaced PMI.  Heart regular S1/S2, no S3/S4, no murmur.  No peripheral edema.  No carotid bruit.  Normal pedal pulses.  Abdomen: Soft, nontender, no hepatosplenomegaly, no distention.  Skin: Intact without lesions or rashes.  Neurologic: Alert and oriented x 3.  Psych: Normal affect. Extremities: No clubbing or cyanosis.  HEENT: Normal.   Assessment/Plan: 1. Chronic systolic CHF: Echo in 10/19 with EF 15%, improved to 30-35% on echo in 12/19. No chest pain.  It is possible that  her cardiomyopathy is due to viral myocarditis, especially given onset that seemed like a sinusitis episode. No strong family history of cardiomyopathy, no heavy ETOH/drugs.  She appears to have had some improvement by echo.  On exam, she is not volume overloaded.  NYHA class II symptoms.  - Continue Coreg 12.5 mg bid.  - Increase Entresto to 97/103 bid.  BMET today and in 10 days.  - Will hold off on spironolactone for now given recurrent vaginal bleeding while taking it.  - She can continue Lasix twice a week.  - Coronary CT angiogram to rule out CAD (unlikely) => will  be able to get this once she gets on the patient assistance program.  - Cardiac MRI to assess for infiltrative disease/myocarditis => will be able to get this once she gets on the patient assistance program.  2. HTN: BP controlled. 3. Patient has no insurance, will have social worker help her with getting on [place] patient assistance program.    Followup in 2 months.     Joy Diaz 10/26/2018

## 2018-10-27 ENCOUNTER — Telehealth (HOSPITAL_COMMUNITY): Payer: Self-pay | Admitting: Emergency Medicine

## 2018-10-27 NOTE — Telephone Encounter (Signed)
Left message on voicemail with name and callback number Shoua Ressler RN Navigator Cardiac Imaging [facility] [phone] Office [phone] Cell  

## 2018-10-27 NOTE — Telephone Encounter (Signed)
Pt returning phone call regarding upcoming cardiac imaging study; pt verbalizes understanding of appt date/time, parking situation and where to check in, pre-test NPO status and medications ordered, and verified current allergies; name and call back number provided for further questions should they arise Kyrstal Monterrosa RN Navigator Cardiac Imaging [facility] [phone] office [phone] cell   

## 2018-10-28 ENCOUNTER — Ambulatory Visit (HOSPITAL_COMMUNITY)
Admission: RE | Admit: 2018-10-28 | Discharge: 2018-10-28 | Disposition: A | Discharge status: Home / Self Care | Payer: Self-pay | Source: Ambulatory Visit | Attending: Cardiology | Admitting: Cardiology

## 2018-10-28 ENCOUNTER — Ambulatory Visit (HOSPITAL_COMMUNITY): Admission: RE | Admit: 2018-10-28 | Payer: Self-pay | Source: Ambulatory Visit

## 2018-10-28 DIAGNOSIS — I429 Cardiomyopathy, unspecified: Secondary | ICD-10-CM

## 2018-10-28 MED ORDER — NITROGLYCERIN 0.4 MG SL SUBL
0.8000 mg | SUBLINGUAL_TABLET | Freq: Once | SUBLINGUAL | Status: AC
  Administered 2018-10-28: 0.8 mg via SUBLINGUAL
  Filled 2018-10-28: qty 25

## 2018-10-28 MED ORDER — IOPAMIDOL (ISOVUE-370) INJECTION 76%
80.0000 mL | Freq: Once | INTRAVENOUS | Status: AC | PRN
  Administered 2018-10-28: 80 mL via INTRAVENOUS

## 2018-10-28 MED ORDER — NITROGLYCERIN 0.4 MG SL SUBL
SUBLINGUAL_TABLET | SUBLINGUAL | Status: AC
  Filled 2018-10-28: qty 2

## 2018-10-31 ENCOUNTER — Telehealth (HOSPITAL_COMMUNITY): Payer: Self-pay | Admitting: Emergency Medicine

## 2018-10-31 NOTE — Telephone Encounter (Signed)
Left message on voicemail with name and callback number Oseias Horsey RN Navigator Cardiac Imaging [facility] [phone] Office [phone] Cell  

## 2018-11-01 NOTE — Progress Notes (Addendum)
PCP: Patient, [name]  Cardiology: Dr. Graciela Husbands HF Cardiology: Dr. Shirlee Latch  HPI: 32 with cardiomyopathy referred by Dr. Graciela Husbands for evaluation of CHF. In 10/19, she developed dyspnea.  She thought she had another episode of sinusitis initially (has had episodes repetitively in the past).  She had congestion, cough, and dyspnea. No chest pain. She had a CXR done that showed cardiomegaly.  Therefore, in 11/19, she had an echo done in [place] showing EF 15%, mild LV dilation, normal RV.  She was started on cardiac medications with cardiomyopathy.  She had vaginal bleeding twice with spironolactone so it was stopped.  She is tolerating Coreg and Entresto.  Repeat echo in 12/19 showed EF 30-35%, mild LVH. Underwent coronary CTA on 10/28/2018, coronary calcium score 0.  She returns for pharmacist-led HF medication titration. She was last seen by Dr. Shirlee Latch on 2/28 where her Sherryll Burger was increased to 97/103 mg twice daily.She continues to experience chest pain (aching) near heart once every couple of weeks, lasting about an hour. Cardiac MRI today. No significant exertional dyspnea at this point.  No orthopnea/PND. Weight stable at home, although doesn't check daily.     . Shortness of breath/dyspnea on exertion? no  . Orthopnea/PND? no . Edema? no . Lightheadedness/dizziness? no . Daily weights at home? no . Blood pressure/heart rate monitoring at home? No - not regularly, has cuff. Only checks when having lightheadedness. Per recall, last 113/67 mHg. . Following low-sodium/fluid-restricted diet? Yes- overall eating better lately  HF Medications: Carvedilol 12.5 mg twice daily Furosemide 40 mg twice weekly Entresto 97/103 mg twice daily  Has the patient been experiencing any side effects to the medications prescribed?  no  Does the patient have any problems obtaining medications due to transportation or finances?   yes Entresto through [employer] of regimen: excellent Understanding of  indications: good Potential of compliance: good Patient understands to avoid NSAIDs. Patient understands to avoid decongestants.    Pertinent Lab Values: . 11/03/18: Scr 0.96, K 3.4 . 10/24/18: Scr 0.96, K 3.7 . 09/05/18: Scr 0.97, K 3.8 . 08/01/18: Scr 1.44, K 4.4  Vital Signs: . Weight: 264 lb (dry weight: 263) . Blood pressure: 118/80 mmHg . Heart rate: 74 bpm  Assessment: 1. Chronic systolic CHF (EF 16-10%, 07/2018), due to viral myocarditis. NYHA class II symptoms. - On exam, she is not volume overloaded.  -  Increase coreg to 25 mg twice daily - Continue Entresto to 97/103 bid.  BMET today stable.  - Spironolactone resulted in recurrent vaginal bleeding while taking it. Eplerenone may be less likely to cause bleeding given greater receptor selectivity  - Continue Lasix twice a week.  - Coronary CT angiogram to rule out CAD: coronary calcium score 0 - Cardiac MRI to assess for infiltrative disease/myocarditis (today)   - Basic disease state pathophysiology, medication indication, mechanism and side effects reviewed at length with patient and he verbalized understanding  2. HTN: BP controlled.  3. Patient has no insurance, will have social worker help her with getting on [place] patient assistance program.     Plan: 1) Medication changes: Based on clinical presentation, vital signs and recent labs will increase carvedilol 25 mg twice daily and start eplerenone 25 mg daily (sent to [facility] outpatient pharmacy for HF fund).  2) Labs: Today and next week 3) Follow-up: Dr Shirlee Latch 12/26/2018   Marcelino Freestone, PharmD PGY2 Cardiology Pharmacy Resident 11/03/2018 4:47 PM     Addendum: Sherron Monday with patient. Her prescription was transferred  to [facility] (copay $10). Scheduled BMET for 3/23. Marcelino Freestone, PharmD PGY2 Cardiology Pharmacy Resident 11/06/2018 1:17 PM

## 2018-11-03 ENCOUNTER — Ambulatory Visit (HOSPITAL_COMMUNITY)
Admission: RE | Admit: 2018-11-03 | Discharge: 2018-11-03 | Disposition: A | Discharge status: Home / Self Care | Payer: Self-pay | Source: Ambulatory Visit | Attending: Internal Medicine | Admitting: Internal Medicine

## 2018-11-03 ENCOUNTER — Ambulatory Visit (HOSPITAL_COMMUNITY)
Admission: RE | Admit: 2018-11-03 | Discharge: 2018-11-03 | Disposition: A | Discharge status: Home / Self Care | Payer: Self-pay | Source: Ambulatory Visit | Attending: Cardiology | Admitting: Cardiology

## 2018-11-03 DIAGNOSIS — I514 Myocarditis, unspecified: Secondary | ICD-10-CM

## 2018-11-03 DIAGNOSIS — I429 Cardiomyopathy, unspecified: Secondary | ICD-10-CM | POA: Insufficient documentation

## 2018-11-03 DIAGNOSIS — I110 Hypertensive heart disease with heart failure: Secondary | ICD-10-CM | POA: Insufficient documentation

## 2018-11-03 DIAGNOSIS — I5022 Chronic systolic (congestive) heart failure: Secondary | ICD-10-CM | POA: Insufficient documentation

## 2018-11-03 DIAGNOSIS — Z79899 Other long term (current) drug therapy: Secondary | ICD-10-CM | POA: Insufficient documentation

## 2018-11-03 LAB — BASIC METABOLIC PANEL
ANION GAP: 9 (ref 5–15)
BUN: 7 mg/dL (ref 6–20)
CHLORIDE: 107 mmol/L (ref 98–111)
CO2: 23 mmol/L (ref 22–32)
Calcium: 9.3 mg/dL (ref 8.9–10.3)
Creatinine, Ser: 0.96 mg/dL (ref 0.44–1.00)
GFR calc non Af Amer: >60 mL/min (ref >60)
GLUCOSE: 115 mg/dL — AB (ref 70–99)
Potassium: 3.4 mmol/L — ABNORMAL LOW (ref 3.5–5.1)
Sodium: 139 mmol/L (ref 135–145)

## 2018-11-03 MED ORDER — GADOBUTROL 1 MMOL/ML IV SOLN
12.0000 mL | Freq: Once | INTRAVENOUS | Status: AC | PRN
  Administered 2018-11-03: 12 mL via INTRAVENOUS

## 2018-11-03 MED ORDER — CARVEDILOL 25 MG PO TABS: 25.0000 mg | ORAL_TABLET | Freq: Two times a day (BID) | ORAL | 3 refills | Status: DC

## 2018-11-03 MED ORDER — EPLERENONE 25 MG PO TABS: 25.0000 mg | ORAL_TABLET | Freq: Every day | ORAL | 11 refills | Status: DC

## 2018-11-03 NOTE — Patient Instructions (Signed)
It was nice meeting you today in the Heart failure clinic at [facility]    Medication changes: Increase carvedilol to 25 mg twice daily    Follow up with Dr Shirlee Latch 12/26/2018.    Do the following things EVERYDAY: 1. Weigh yourself in the morning before breakfast. Write it down and keep it in a log. 2. Take your medicines as prescribed 3. Eat low salt foods-Limit salt (sodium) to 2000 mg per day.  4. Stay as active as you can everyday 5. Limit all fluids for the day to less than 2 liters

## 2018-11-07 ENCOUNTER — Encounter (HOSPITAL_COMMUNITY): Payer: Self-pay

## 2018-11-10 ENCOUNTER — Other Ambulatory Visit (HOSPITAL_COMMUNITY): Payer: Self-pay | Admitting: *Deleted

## 2018-11-10 ENCOUNTER — Encounter (HOSPITAL_COMMUNITY): Payer: Self-pay

## 2018-11-17 ENCOUNTER — Other Ambulatory Visit (HOSPITAL_COMMUNITY): Payer: Self-pay

## 2018-11-18 ENCOUNTER — Telehealth (HOSPITAL_COMMUNITY): Payer: Self-pay | Admitting: Pharmacist

## 2018-11-18 NOTE — Telephone Encounter (Signed)
Contacted [employer] Patient Assistance regarding [place]. Verbal-ed order for: Entresto 97-103 mg 1 tablet twice daily #180, 2 refills under Dr. Shirlee Latch.  Patient was also contacted regarding missed lab appointment. She had cancelled as she just started taking her spironolactone last Friday. Rescheduled lab appt for 3/30.

## 2018-11-24 ENCOUNTER — Other Ambulatory Visit (HOSPITAL_COMMUNITY): Payer: Self-pay

## 2018-12-25 ENCOUNTER — Ambulatory Visit (HOSPITAL_COMMUNITY)
Admission: RE | Admit: 2018-12-25 | Discharge: 2018-12-25 | Disposition: A | Discharge status: Home / Self Care | Payer: Self-pay | Source: Ambulatory Visit | Attending: Cardiology | Admitting: Cardiology

## 2018-12-25 ENCOUNTER — Other Ambulatory Visit: Payer: Self-pay

## 2018-12-25 ENCOUNTER — Telehealth (HOSPITAL_COMMUNITY): Payer: Self-pay

## 2018-12-25 DIAGNOSIS — I5022 Chronic systolic (congestive) heart failure: Secondary | ICD-10-CM

## 2018-12-25 DIAGNOSIS — I110 Hypertensive heart disease with heart failure: Secondary | ICD-10-CM

## 2018-12-25 NOTE — Progress Notes (Signed)
Heart Failure TeleHealth Note  Due to national recommendations of social distancing due to COVID 19, Audio/video telehealth visit is felt to be most appropriate for this patient at this time.  See MyChart message from today for patient consent regarding telehealth for [facility].  Date:  12/25/2018   ID:  Joy Diaz, DOB 1986/02/21, MRN [id number]  Location: Home  Provider location: [facility] Type of Visit: Established patient   PCP:  Patient, [name]  Cardiologist:  Dr. Shirlee Latch  Chief Complaint: Shortness of breath   History of Present Illness: Joy Diaz is a 33 y.o. female who presents via audio/video conferencing for a telehealth visit today.     she denies symptoms worrisome for COVID 19.   32 with cardiomyopathy referred by Dr. Graciela Husbands for evaluation of CHF.   In 10/19, she developed dyspnea.  She thought she had another episode of sinusitis initially (has had episodes repetitively in the past).  She had congestion, cough, and dyspnea. No chest pain. She had a CXR done that showed cardiomegaly.  Therefore, in 11/19, she had an echo done in [place] showing EF 15%, mild LV dilation, normal RV.  She was started on cardiac medications with cardiomyopathy.  She had vaginal bleeding twice with spironolactone so stopped it.  She is tolerating Coreg and Entresto.  Repeat echo in 12/19 showed EF 30-35%, mild LVH.    In 3/20, she had a coronary CTA showing no significant coronary disease.  She also had a cardiac MRI. This showed normalization of LVEF at 59% and no delayed enhancement.    She is feeling good.  She has lost some weight with diet and exercise.  No significant exertional dyspnea.  No edema.  No lightheadedness.  No orthopnea/PND.   Labs (12/19): K 4.4, creatinine 1.44 Labs (1/20): K 3.8, creatinine 0.97 Labs (3/20): K 3.4, creatinine 0.96  PMH: 1. Obesity 2. Bipolar disorder 3. Recurrent sinusitis 4. HTN: Also with pre-eclampsia  history.  5. Chronic systolic CHF:  - Echo (11/19, [place]): EF 15%, mild LV dilation, severe LAE, normal RV size and systolic function.  - Echo (12/19): EF 30-35%, mild LVH, moderate diastolic dysfunction.  - Coronary CTA (3/20): Coronary calcium score 0, no significant coronary disease.  - Cardiac MRI (3/20): LVEF 59%, RVEF 63%, no LGE.   Current Outpatient Medications  Medication Sig Dispense Refill  . carvedilol (COREG) 25 MG tablet Take 1 tablet (25 mg total) by mouth 2 (two) times daily. 180 tablet 3  . eplerenone (INSPRA) 25 MG tablet Take 1 tablet (25 mg total) by mouth daily. 30 tablet 11  . omeprazole (PRILOSEC) 10 MG capsule Take 40 mg by mouth daily.     . sacubitril-valsartan (ENTRESTO) 97-103 MG Take 1 tablet by mouth 2 (two) times daily. 60 tablet 11   No current facility-administered medications for this encounter.     Allergies:   Spironolactone   Social History:  The patient  reports that she has never smoked. She has never used smokeless tobacco. She reports that she does not drink alcohol or use drugs.   Family History:  The patient's family history includes Asthma in her mother; CAD in her paternal grandfather; Celiac disease in her maternal grandmother; Heart disease in her maternal grandfather; Irritable bowel syndrome in her brother; Migraines in her mother; Osteopenia in her maternal grandmother; Rheum arthritis in her mother; Ulcerative colitis in her sister.   ROS:  Please see the history of present  illness.   All other systems are personally reviewed and negative.   Exam:  (Video/Tele Health Call; Exam is subjective and or/visual.) General:  Speaks in full sentences. No resp difficulty. Neck: No JVD Lungs: Normal respiratory effort with conversation.  Abdomen: Non-distended per patient report Extremities: Pt denies edema. Neuro: Alert & oriented x 3.   Recent Labs: 11/03/2018: BUN 7; Creatinine, Ser 0.96; Potassium 3.4; Sodium 139  Personally reviewed    Wt Readings from Last 3 Encounters:  11/03/18 120.1 kg (264 lb 12.8 oz)  10/24/18 121.5 kg (267 lb 12.8 oz)  09/05/18 124.4 kg (274 lb 3.2 oz)      ASSESSMENT AND PLAN:  1. Chronic systolic CHF: Nonischemic cardiomyopathy.  Echo in 10/19 with EF 15%, improved to 30-35% on echo in 12/19. No chest pain.  It is possible that her cardiomyopathy is due to viral myocarditis, especially given onset that seemed like a sinusitis episode. No strong family history of cardiomyopathy, no heavy ETOH/drugs. Coronary CTA in 3/20 showed no coronary disease.  Cardiac MRI in 3/20 showed improvement in LVEF back to 59%.   On exam, she is not volume overloaded.  NYHA class II symptoms.  We discussed her cardiac meds.  I have no data that she can stop them safely now that EF is back to normal.  As long as she is tolerating them, will continue for now.  - Continue Coreg 12.5 mg bid.  - Continue Entresto 97/103 bid.  - Continue eplerenone.   - She can stop Lasix.  - I will arrange for BMET.  After that, she will need a BMET at least every 3 months.   - Repeat echo at 1 year to make sure EF stays normal.  2. HTN: BP has been controlled.   COVID screen The patient does not have any symptoms that suggest any further testing/ screening at this time.  Social distancing reinforced today.  Patient Risk: After full review of this patients clinical status, I feel that they are at moderate risk for cardiac decompensation at this time.  Relevant cardiac medications were reviewed at length with the patient today. The patient does not have concerns regarding their medications at this time.   Recommended follow-up:  6 months  Today, I have spent 17 minutes with the patient with telehealth technology discussing the above issues .    Signed, Marca Ancona, MD  12/25/2018 10:29 PM  [facility] [address] [facility] V[facility] [place] [address] [phone] (office)  [phone] (fax)

## 2018-12-25 NOTE — Telephone Encounter (Signed)
-----   Message from Laurey Morale, MD sent at 12/25/2018  1:15 PM EDT ----- 1. Stop Lasix.  2. BMET, can do in [place].  3. BMET q 3 months.  4. Followup with me in 6 months.

## 2018-12-25 NOTE — Patient Instructions (Signed)
DISCONTINUE Lasix.  Please complete lab work as soon as possible, a prescription has been included.  Your physician wants you to follow-up in: 6 MONTHS You will receive a reminder letter in the mail two months in advance. If you don't receive a letter, please call our office to schedule the follow-up appointment.

## 2018-12-25 NOTE — Telephone Encounter (Signed)
AVS mailed with BMET script, pt placed on recall list for 6 month f/u

## 2018-12-26 ENCOUNTER — Encounter (HOSPITAL_COMMUNITY): Payer: Self-pay | Admitting: Cardiology

## 2019-08-25 NOTE — Progress Notes (Signed)
Cardiology Office Note Date:  08/27/2019  Patient ID:  Joy, Diaz 11/29/1985, MRN [id number] PCP:  Patient, [name]  Cardiologist:  Dr. Aundra Dubin Electrophysiologist: Dr. Caryl Comes    Chief Complaint: routine follow up  History of Present Illness: Joy Diaz is a 33 y.o. female with history of bipolar disorder, HTN, NICM, chronic CHF (systolic), orthostatic dizziness.  She comes in today to be seen for Dr. Caryl Comes, last seen by him Nov 2019, this was in the beginning of her dx of NICM, w/u was underway, she has had regular follow with dr. Aundra Dubin since then.  Most recently had tele health visit with Dr. Aundra Dubin April 2020, she was doing well, had lost some weight.  LVEF had improved by c.MRI.  Noted intolerance to spironolactone (2/2 vaginal bleeding), tolerating current regime, lasix was stopped, other meds continued, planned for routine labs and annual echos and follow up.  She continues to be doing quite well. 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  She has started to get back to some exercise, recently started yoga. She denies any kind of CP, palpitations or cardiac awareness.  NO SOB or DOE.  No dizzy spells, near syncope or syncope.   Past Medical History:  Diagnosis Date  . Anxiety   . Enlarged heart   . GERD (gastroesophageal reflux disease)   . Hypertensive urgency   . Obese   . Sinusitis     Past Surgical History:  Procedure Laterality Date  . MANDIBLE SURGERY Bilateral 2006  . TUBAL LIGATION Bilateral 2015    Current Outpatient Medications  Medication Sig Dispense Refill  . eplerenone (INSPRA) 25 MG tablet Take 1 tablet (25 mg total) by mouth daily. 30 tablet 11  . fluticasone (FLONASE) 50 MCG/ACT nasal spray Place 2 sprays into both nostrils daily.    Marland Kitchen omeprazole (PRILOSEC) 10 MG capsule Take 40 mg by mouth daily.     . sacubitril-valsartan (ENTRESTO) 97-103 MG Take 1 tablet by mouth 2  (two) times daily. 60 tablet 11  . carvedilol (COREG) 25 MG tablet Take 1 tablet (25 mg total) by mouth 2 (two) times daily. 180 tablet 3   No current facility-administered medications for this visit.    Allergies:   Spironolactone   Social History:  The patient  reports that she has never smoked. She has never used smokeless tobacco. She reports that she does not drink alcohol or use drugs.   Family History:  The patient's family history includes Asthma in her mother; CAD in her paternal grandfather; Celiac disease in her maternal grandmother; Heart disease in her maternal grandfather; Irritable bowel syndrome in her brother; Migraines in her mother; Osteopenia in her maternal grandmother; Rheum arthritis in her mother; Ulcerative colitis in her sister.  ROS:  Please see the history of present illness.  All other systems are reviewed and otherwise negative.   PHYSICAL EXAM:  VS:  BP 124/72   Pulse 62   Ht 5\' 6"  (1.676 m)   Wt 280 lb (127 kg)   BMI 45.19 kg/m  BMI: Body mass index is 45.19 kg/m. Well nourished, well developed, in no acute distress  HEENT: normocephalic, atraumatic  Neck: no JVD, carotid bruits or masses Cardiac:  RRR; no significant murmurs, no rubs, or gallops Lungs:  CTA b/l, no wheezing, rhonchi or rales  Abd: soft, nontender, obese MS: no deformity or  atrophy Ext: no edema  Skin: warm and  dry, no rash Neuro:  No gross deficits appreciated Psych: euthymic mood, full affect    EKG:  Done today and reviewed by myself shows  SR, 68bpm, no ST/T changes   11/03/2018: c.MRI IMPRESSION: 1.  Normal LV size and systolic function, EF 59%. 2.  Mildly dilated RV with normal systolic function, EF 63%. 3. No myocardial LGE, so no definitive evidence for prior myocardial infarction, myocarditis, or infiltrative disease.    10/28/2018: Coronary CT IMPRESSION: 1. Coronary artery calcium score 0 Agatston units, suggesting low risk for future cardiac events. 2.  No  significant coronary disease.   08/01/2018: TTE Study Conclusions - Left ventricle: The cavity size was normal. Wall thickness was   increased in a pattern of mild LVH. Systolic function was   moderately to severely reduced. The estimated ejection fraction   was in the range of 30% to 35%. Diffuse hypokinesis. Features are   consistent with a pseudonormal left ventricular filling pattern,   with concomitant abnormal relaxation and increased filling   pressure (grade 2 diastolic dysfunction). - Ventricular septum: Septal motion showed abnormal function and   dyssynergy. - Pulmonic valve: There was moderate regurgitation.   07/08/2018: LVEF < 20%    Recent Labs: 11/03/2018: BUN 7; Creatinine, Ser 0.96; Potassium 3.4; Sodium 139  No results found for requested labs within last 8760 hours.   CrCl cannot be calculated (Patient's most recent lab result is older than the maximum 21 days allowed.).   Wt Readings from Last 3 Encounters:  08/27/19 280 lb (127 kg)  11/03/18 264 lb 12.8 oz (120.1 kg)  10/24/18 267 lb 12.8 oz (121.5 kg)     Other studies reviewed: Additional studies/records reviewed today include: summarized above  ASSESSMENT AND PLAN:  1. NICM     Recovery of her LVEF by her c.MRI in March     No symptoms or exam findings to suggest volume OL     She is in BB, Entresto, Eplerenone     (intolerant of spironolactone w/vaginal bleeding)  She has had a tubal ligation We will get BMET today and plan for Q 3 months Will plan to see her back and plan for echo in about 28mo, will need annual echos    2. HTN ?     BP looks good  3. Obesity     We discussed healy lifestyle, importance of diet, exercise, weight loss    Disposition: as above  Current medicines are reviewed at length with the patient today.  The patient did not have any concerns regarding medicines.  Joy Diaz, Aven, PA-C 08/27/2019 9:16 AM     [facility] [address] [place] [address] [phone] (office)  [phone] (fax)

## 2019-08-27 ENCOUNTER — Other Ambulatory Visit: Payer: Self-pay

## 2019-08-27 ENCOUNTER — Ambulatory Visit (INDEPENDENT_AMBULATORY_CARE_PROVIDER_SITE_OTHER): Payer: Self-pay | Admitting: Physician Assistant

## 2019-08-27 VITALS — BP 124/72 | HR 62 | Ht 66.0 in | Wt 280.0 lb

## 2019-08-27 DIAGNOSIS — I428 Other cardiomyopathies: Secondary | ICD-10-CM

## 2019-08-27 DIAGNOSIS — Z79899 Other long term (current) drug therapy: Secondary | ICD-10-CM

## 2019-08-27 DIAGNOSIS — I10 Essential (primary) hypertension: Secondary | ICD-10-CM

## 2019-08-27 LAB — BASIC METABOLIC PANEL
BUN/Creatinine Ratio: 11 (ref 9–23)
BUN: 10 mg/dL (ref 6–20)
CO2: 23 mmol/L (ref 20–29)
Calcium: 9.1 mg/dL (ref 8.7–10.2)
Chloride: 100 mmol/L (ref 96–106)
Creatinine, Ser: 0.92 mg/dL (ref 0.57–1.00)
GFR calc Af Amer: 95 mL/min/{1.73_m2} (ref >59)
GFR calc non Af Amer: 83 mL/min/{1.73_m2} (ref >59)
Glucose: 89 mg/dL (ref 65–99)
Potassium: 4 mmol/L (ref 3.5–5.2)
Sodium: 135 mmol/L (ref 134–144)

## 2019-08-27 NOTE — Patient Instructions (Signed)
Medication Instructions:   Your physician recommends that you continue on your current medications as directed. Please refer to the Current Medication list given to you today.  *If you need a refill on your cardiac medications before your next appointment, please call your pharmacy*  Lab Work:   BMET TODAY   BMET IN 3 MONTHS   If you have labs (blood work) drawn today and your tests are completely normal, you will receive your results only by: Marland Kitchen MyChart Message (if you have MyChart) OR . A paper copy in the mail If you have any lab test that is abnormal or we need to change your treatment, we will call you to review the results.  Testing/Procedures: NONE ORDERED  TODAY   Follow-Up: At [facility], you and your health needs are our priority.  As part of our continuing mission to provide you with exceptional heart care, we have created designated Provider Care Teams.  These Care Teams include your primary Cardiologist (physician) and Advanced Practice Providers (APPs -  Physician Assistants and Nurse Practitioners) who all work together to provide you with the care you need, when you need it.  Your next appointment:   5 month(s)  The format for your next appointment:   In Person  Provider:   You may see Dr. Caryl Comes  or one of the following Advanced Practice Providers on your designated Care Team:    Chanetta Marshall, NP  Tommye Standard, PA-C  Legrand Como "Oda Kilts, [place]   Other Instructions

## 2019-09-02 ENCOUNTER — Telehealth: Payer: Self-pay | Admitting: Internal Medicine

## 2019-09-02 NOTE — Telephone Encounter (Signed)
Patient following up on [facility] re-certification. States she dropped off some paperwork yesterday. Asking to speak directly with [facility].

## 2019-09-03 NOTE — Telephone Encounter (Addendum)
**Note De-Identified Morganne Haile Obfuscation** LMTCB.  The pt sees Dr Graciela Husbands as needed and sees Dr Shirlee Latch for her CHF and he manages her Sherryll Burger.  Application should go to our CHF Clinic.

## 2019-09-04 NOTE — Telephone Encounter (Signed)
**Note De-Identified Myana Schlup Obfuscation** We have received the pts application and will fax to Dr Kathlyn Sacramento office to address.

## 2019-09-07 ENCOUNTER — Telehealth (HOSPITAL_COMMUNITY): Payer: Self-pay | Admitting: Pharmacist

## 2019-09-07 ENCOUNTER — Telehealth: Payer: Self-pay | Admitting: Physician Assistant

## 2019-09-07 NOTE — Telephone Encounter (Signed)
**Note De-Identified Vue Pavon Obfuscation** The pt sees Dr Shirlee Latch at our CHF Clinic. We faxed her application to them Friday. Will forward mess to the CHF Clinic.

## 2019-09-07 NOTE — Telephone Encounter (Signed)
Joy Diaz calling stating the patient's portion of [employer] re-enrollment application for Entresto PAP was not signed. She states she called the patient who said she had signed it. She would like for the application to be faxed over to Dr. Kathlyn Sacramento office again.

## 2019-09-07 NOTE — Telephone Encounter (Signed)
Received patient portion of [employer] re-enrollment application for Entresto PAP. However, patient did not sign form. Left her VM explaining that will need her to sign form before application can be submitted.  Karle Plumber, PharmD, BCPS, BCCP, CPP Heart Failure Clinic Pharmacist [phone]

## 2019-09-07 NOTE — Telephone Encounter (Signed)
Joy Diaz, pharm D is addressing

## 2019-10-19 NOTE — Telephone Encounter (Signed)
Sent in Manufacturer's Assistance application to [employer] for Entresto Re-enrollment.    Application pending, will continue to follow.  Darlin Stenseth, PharmD, BCPS, BCCP, CPP Heart Failure Clinic Pharmacist [phone]  

## 2019-10-21 ENCOUNTER — Encounter (HOSPITAL_COMMUNITY): Payer: Self-pay

## 2019-10-27 NOTE — Telephone Encounter (Signed)
Advanced Heart Failure Patient Advocate Encounter   Patient was approved to receive Entresto from [employer]. Left VM.  Patient ID: [id number] Effective dates: 10/27/2019 through 10/26/2020  Karle Plumber, PharmD, BCPS, BCCP, CPP Heart Failure Clinic Pharmacist [phone]

## 2019-11-25 ENCOUNTER — Other Ambulatory Visit: Payer: BC Managed Care – PPO | Admitting: *Deleted

## 2019-11-25 ENCOUNTER — Other Ambulatory Visit: Payer: Self-pay

## 2019-11-25 DIAGNOSIS — Z79899 Other long term (current) drug therapy: Secondary | ICD-10-CM

## 2019-11-26 LAB — BASIC METABOLIC PANEL
BUN/Creatinine Ratio: 6 — ABNORMAL LOW (ref 9–23)
BUN: 6 mg/dL (ref 6–20)
CO2: 23 mmol/L (ref 20–29)
Calcium: 9.3 mg/dL (ref 8.7–10.2)
Chloride: 104 mmol/L (ref 96–106)
Creatinine, Ser: 0.96 mg/dL (ref 0.57–1.00)
GFR calc Af Amer: 90 mL/min/{1.73_m2} (ref >59)
GFR calc non Af Amer: 78 mL/min/{1.73_m2} (ref >59)
Glucose: 102 mg/dL — ABNORMAL HIGH (ref 65–99)
Potassium: 3.7 mmol/L (ref 3.5–5.2)
Sodium: 141 mmol/L (ref 134–144)

## 2019-12-08 ENCOUNTER — Other Ambulatory Visit (HOSPITAL_COMMUNITY): Payer: Self-pay | Admitting: *Deleted

## 2019-12-08 ENCOUNTER — Encounter (HOSPITAL_COMMUNITY): Payer: Self-pay

## 2019-12-08 MED ORDER — EPLERENONE 25 MG PO TABS: 25.0000 mg | ORAL_TABLET | Freq: Every day | ORAL | 11 refills | Status: DC

## 2019-12-24 IMAGING — CT CT HEART MORP W/ CTA COR W/ SCORE W/ CA W/CM &/OR W/O CM
4 of 7 series · 8 of 20 positions shown, 9 images · non-contrast
Comparison: None.
COMPARISON: None.
COMPARISON: None.

Addendum:
EXAM:
OVER-READ INTERPRETATION  CT CHEST

The following report is an over-read performed by radiologist Dr.
Ingunn Harpa Ronlor [REDACTED] on 10/28/2018. This over-read
does not include interpretation of cardiac or coronary anatomy or
pathology. The coronary CTA interpretation by the cardiologist is
attached.
CLINICAL DATA: Chest pain
Cardiac CTA
MEDICATIONS:
Sub lingual nitro. 4mg x 2
TECHNIQUE: The patient was scanned on a Siemens [REDACTED]ice scanner. Gantry
rotation speed was 250 msecs. Collimation was 0.6 mm. A 100 kV
prospective scan was triggered in the ascending thoracic aorta at
35-75% of the R-R interval. Average HR during the scan was 60 bpm.
The 3D data set was interpreted on a dedicated work station using
MPR, MIP and VRT modes. A total of 80cc of contrast was used.

[Series 6: best diast 74 % · axial · 0.39mm/px · z∈[+1225,+1268]mm · 2 of 326 slices shown, 3 images]
[im 109/326  vessel]
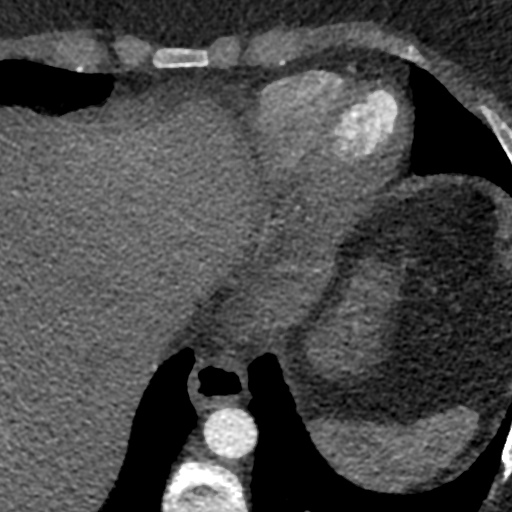
[im 109/326  lung]
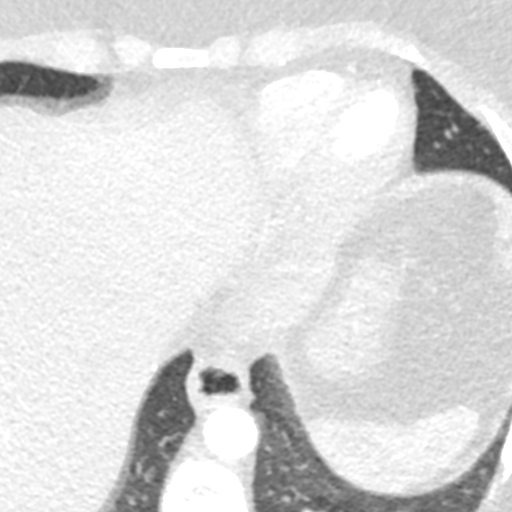
[im 217/326  vessel]
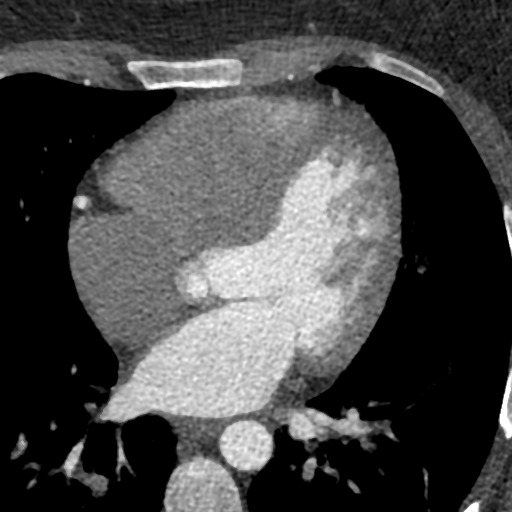

[Series 7: best syst 35 % · axial · 0.39mm/px · z∈[+1225,+1268]mm · 2 of 326 slices shown]
[im 109/326  vessel]
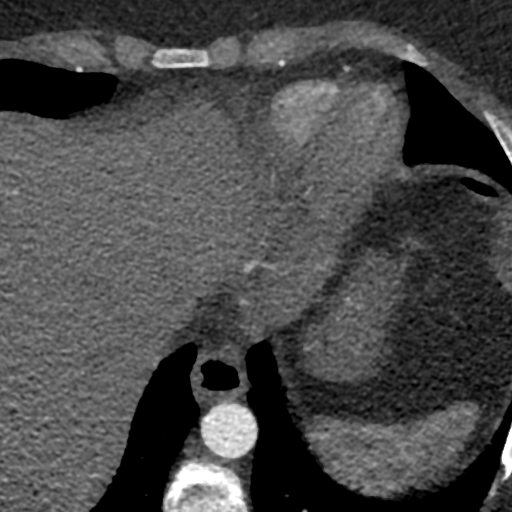
[im 217/326  vessel]
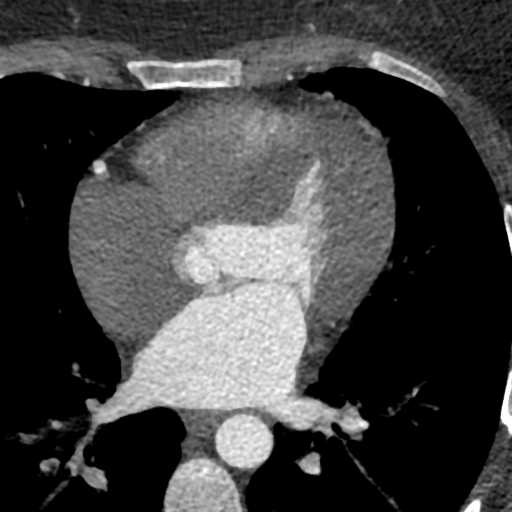

[Series 8: ts diast sharp 74 % · axial · 0.39mm/px · z∈[+1225,+1268]mm · 2 of 326 slices shown]
[im 109/326  lung]
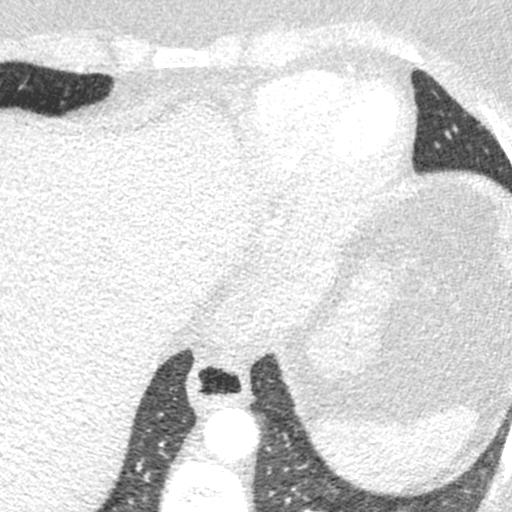
[im 217/326  lung]
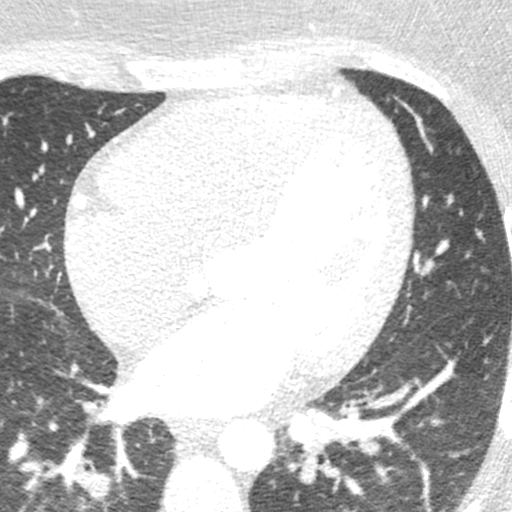

[Series 9: ts syst sharp 35 % · axial · 0.39mm/px · z∈[+1225,+1268]mm · 2 of 326 slices shown]
[im 109/326  lung]
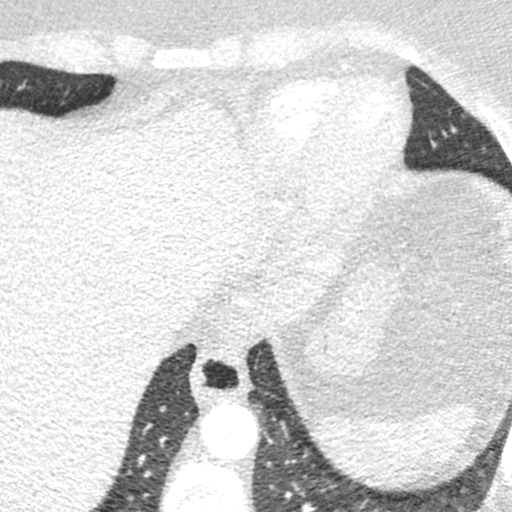
[im 217/326  lung]
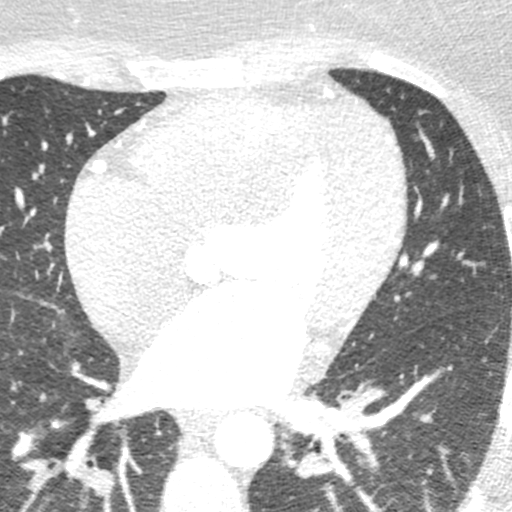

[8 of 20 positions shown; findings below may reference images not displayed]

FINDINGS: Vascular: Heart is normal size.  Visualized aorta normal caliber.

Mediastinum/Nodes: No adenopathy in the lower mediastinum or hila.

Lungs/Pleura: Visualized lungs clear.  No effusions.

Upper Abdomen: Imaging into the upper abdomen shows no acute
findings.

Musculoskeletal: Chest wall soft tissues are unremarkable. No acute
bony abnormality.
IMPRESSION: No acute or significant extracardiac abnormality.
FINDINGS: Non-cardiac: See separate report from [REDACTED].

The pulmonary veins drained normally to the left atrium.

Calcium Score: 0 Agatston units.

Coronary Arteries: Right dominant with no anomalies

LM: No plaque or stenosis.

LAD system:  No plaque or stenosis.

Circumflex system: No plaque or stenosis.  Small vessel.

RCA system: No plaque or stenosis.
IMPRESSION: 1. Coronary artery calcium score 0 Agatston units, suggesting low
risk for future cardiac events.

2.  No significant coronary disease.

Dilshod Ussery

*** End of Addendum ***
Addendum:
EXAM:
OVER-READ INTERPRETATION  CT CHEST

The following report is an over-read performed by radiologist Dr.
Ingunn Harpa Ronlor [REDACTED] on 10/28/2018. This over-read
does not include interpretation of cardiac or coronary anatomy or
pathology. The coronary CTA interpretation by the cardiologist is
attached.
FINDINGS: Vascular: Heart is normal size.  Visualized aorta normal caliber.

Mediastinum/Nodes: No adenopathy in the lower mediastinum or hila.

Lungs/Pleura: Visualized lungs clear.  No effusions.

Upper Abdomen: Imaging into the upper abdomen shows no acute
findings.

Musculoskeletal: Chest wall soft tissues are unremarkable. No acute
bony abnormality.
IMPRESSION: No acute or significant extracardiac abnormality.
FINDINGS: Non-cardiac: See separate report from [REDACTED].

The pulmonary veins drained normally to the left atrium.

Calcium Score: 0 Agatston units.

Coronary Arteries: Right dominant with no anomalies

LM: No plaque or stenosis.

LAD system:  No plaque or stenosis.

Circumflex system: No plaque or stenosis.  Small vessel.

RCA system: No plaque or stenosis.
IMPRESSION: 1. Coronary artery calcium score 0 Agatston units, suggesting low
risk for future cardiac events.

2.  No significant coronary disease.

Dilshod Ussery

*** End of Addendum ***
EXAM:
OVER-READ INTERPRETATION  CT CHEST

The following report is an over-read performed by radiologist Dr.
Ingunn Harpa Ronlor [REDACTED] on 10/28/2018. This over-read
does not include interpretation of cardiac or coronary anatomy or
pathology. The coronary CTA interpretation by the cardiologist is
attached.
FINDINGS: Vascular: Heart is normal size.  Visualized aorta normal caliber.

Mediastinum/Nodes: No adenopathy in the lower mediastinum or hila.

Lungs/Pleura: Visualized lungs clear.  No effusions.

Upper Abdomen: Imaging into the upper abdomen shows no acute
findings.

Musculoskeletal: Chest wall soft tissues are unremarkable. No acute
bony abnormality.
IMPRESSION: No acute or significant extracardiac abnormality.

## 2020-01-01 ENCOUNTER — Other Ambulatory Visit (HOSPITAL_COMMUNITY): Payer: Self-pay

## 2020-01-01 ENCOUNTER — Other Ambulatory Visit (HOSPITAL_COMMUNITY): Payer: Self-pay | Admitting: Cardiology

## 2020-01-01 ENCOUNTER — Encounter (HOSPITAL_COMMUNITY): Payer: Self-pay

## 2020-01-04 ENCOUNTER — Other Ambulatory Visit (HOSPITAL_COMMUNITY): Payer: Self-pay | Admitting: *Deleted

## 2020-01-04 ENCOUNTER — Other Ambulatory Visit (HOSPITAL_COMMUNITY): Payer: Self-pay | Admitting: Cardiology

## 2020-01-04 MED ORDER — EPLERENONE 25 MG PO TABS: 25.0000 mg | ORAL_TABLET | Freq: Every day | ORAL | 11 refills | Status: DC

## 2020-01-04 MED ORDER — CARVEDILOL 25 MG PO TABS: 25.0000 mg | ORAL_TABLET | Freq: Two times a day (BID) | ORAL | 1 refills | Status: DC

## 2020-03-14 ENCOUNTER — Other Ambulatory Visit (HOSPITAL_COMMUNITY): Payer: Self-pay | Admitting: Cardiology

## 2020-04-01 ENCOUNTER — Other Ambulatory Visit (HOSPITAL_COMMUNITY): Payer: Self-pay | Admitting: Cardiology

## 2020-05-09 ENCOUNTER — Other Ambulatory Visit (HOSPITAL_COMMUNITY): Payer: Self-pay | Admitting: Cardiology

## 2020-07-15 ENCOUNTER — Other Ambulatory Visit (HOSPITAL_COMMUNITY): Payer: Self-pay | Admitting: Cardiology

## 2020-09-05 ENCOUNTER — Other Ambulatory Visit (HOSPITAL_COMMUNITY): Payer: Self-pay | Admitting: Cardiology

## 2020-09-10 ENCOUNTER — Encounter (HOSPITAL_COMMUNITY): Payer: Self-pay

## 2020-09-12 ENCOUNTER — Other Ambulatory Visit (HOSPITAL_COMMUNITY): Payer: Self-pay | Admitting: *Deleted

## 2020-09-12 MED ORDER — CARVEDILOL 25 MG PO TABS: ORAL_TABLET | ORAL | 0 refills | Status: DC

## 2020-10-17 ENCOUNTER — Other Ambulatory Visit (HOSPITAL_COMMUNITY): Payer: Self-pay

## 2020-10-17 ENCOUNTER — Telehealth (HOSPITAL_COMMUNITY): Payer: Self-pay | Admitting: Pharmacy Technician

## 2020-10-17 MED ORDER — SACUBITRIL-VALSARTAN 97-103 MG PO TABS: 1.0000 | ORAL_TABLET | Freq: Two times a day (BID) | ORAL | 3 refills | Status: DC

## 2020-10-17 NOTE — Telephone Encounter (Signed)
Meds ordered this encounter  Medications  . sacubitril-valsartan (ENTRESTO) 97-103 MG    Sig: Take 1 tablet by mouth 2 (two) times daily.    Dispense:  180 tablet    Refill:  3    Bill copay card BIN [id number] [facility] [id number] ID [id number]

## 2020-10-17 NOTE — Telephone Encounter (Signed)
It's time to re-enroll patient to receive medication assistance for Entresto from [employer]. She has Nurse, learning disability, was able to activate a $10 co-pay card. Had co-pay card sent to patient's email on file.   Current 90 day co-pay without the co-pay card is $180.  BIN [id number] [facility] [id number] ID [id number]  Called to update the patient on the change in assistance she will have after her eligibility period is over. Voicemail was not set up. Asked Philicia (CMA) to send a 90 day RX to the patient's pharmacy with the co-pay card information attached.  Archer Asa, CPhT

## 2021-02-18 NOTE — Progress Notes (Signed)
Cardiology Office Note Date:  02/20/2021  Patient ID:  Ladaija, Dimino 1986/06/28, MRN [id number] PCP:  Patient, [name] (Inactive)  Cardiologist:  Dr. Shirlee Latch Electrophysiologist: Dr. Graciela Husbands    Chief Complaint: overdue annual follow up  History of Present Illness: Tammera Engert is a 35 y.o. female with history of bipolar disorder, HTN, NICM, chronic CHF (systolic), orthostatic dizziness.  She comes in today to be seen for Dr. Graciela Husbands, last seen by him Nov 2019, this was in the beginning of her dx of NICM, w/u was underway, she has had regular follow with dr. Shirlee Latch since then.  She last  had a tele health visit with Dr. Shirlee Latch April 2020, she was doing well, had lost some weight.  LVEF had improved by c.MRI.  Noted intolerance to spironolactone (2/2 vaginal bleeding), tolerating current regime, lasix was stopped, other meds continued, planned for routine labs and annual echos and follow up.   I saw her 08/27/2019 She continues to be doing quite well. 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  She has started to get back to some exercise, recently started yoga. She denies any kind of CP, palpitations or cardiac awareness.  NO SOB or DOE.  No dizzy spells, near syncope or syncope. No changes were made, recommended annual echos  Doesn't look likse she has seen anyone since.  TODAY She is generally doing OK, has quite a bit of personal stress, still at it in school, moved recently (to a multi family situation), in a custody battle with her ex over their son. She has been without her meds for about a month or so. SHe has gained weight this has been slow and steady and knows it is 2/2 food choices, though this has been very difficult for her to make changes in her living arrangement.  She denies any SOB/DOE with her ADLs and usual activities, will get a little winded or SOB when she is rushing/doing heavy activities. No  dizzy spells, near syncope or syncope. She denies symptoms of PND or orthopnea. She had an episode of sharp CP during an argument over custody though was brief and never has had any CP otherwise.   Past Medical History:  Diagnosis Date   Anxiety    Enlarged heart    GERD (gastroesophageal reflux disease)    Hypertensive urgency    Obese    Sinusitis     Past Surgical History:  Procedure Laterality Date   MANDIBLE SURGERY Bilateral 2006   TUBAL LIGATION Bilateral 2015    Current Outpatient Medications  Medication Sig Dispense Refill   carvedilol (COREG) 25 MG tablet TAKE 1 TABLET BY MOUTH TWICE DAILY WITH A MEAL. MUST HAVE APPT FOR FURTHER REFILLS 60 tablet 0   cetirizine (ZYRTEC) 5 MG tablet Take 5 mg by mouth daily.     eplerenone (INSPRA) 25 MG tablet TAKE 1 TABLET BY MOUTH ONCE DAILY 30 tablet 11   fluticasone (FLONASE) 50 MCG/ACT nasal spray Place 2 sprays into both nostrils daily.     omeprazole (PRILOSEC) 10 MG capsule Take 40 mg by mouth daily.      sacubitril-valsartan (ENTRESTO) 49-51 MG Take 1 tablet by mouth 2 (two) times daily. UNTIL RESUMES BACK 97-103 TWICE A DAY 28 tablet 0   sacubitril-valsartan (ENTRESTO) 97-103 MG Take 1 tablet by mouth 2 (two) times daily. 180 tablet 3   No current facility-administered medications for this visit.  Allergies:   Spironolactone   Social History:  The patient  reports that she has never smoked. She has never used smokeless tobacco. She reports that she does not drink alcohol and does not use drugs.   Family History:  The patient's family history includes Asthma in her mother; CAD in her paternal grandfather; Celiac disease in her maternal grandmother; Heart disease in her maternal grandfather; Irritable bowel syndrome in her brother; Migraines in her mother; Osteopenia in her maternal grandmother; Rheum arthritis in her mother; Ulcerative colitis in her sister.  ROS:  Please see the history of present illness.  All other  systems are reviewed and otherwise negative.   PHYSICAL EXAM:  VS:  BP (!) 150/100   Pulse 90   Ht 5\' 6"  (1.676 m)   Wt (!) 317 lb 12.8 oz (144.2 kg)   SpO2 97%   BMI 51.29 kg/m  BMI: Body mass index is 51.29 kg/m. Well nourished, well developed, in no acute distress  HEENT: normocephalic, atraumatic  Neck: no JVD, carotid bruits or masses Cardiac:  RRR; no significant murmurs, no rubs, or gallops Lungs:  CTA b/l, no wheezing, rhonchi or rales  Abd: soft, nontender, obese MS: no deformity or  atrophy Ext: no edema  Skin: warm and dry, no rash Neuro:  No gross deficits appreciated Psych: euthymic mood, full affect    EKG:  Done today and reviewed by myself shows  SR 90bpm, QV lead II, suspect lead position, no significant changes otherwise   11/03/2018: c.MRI IMPRESSION: 1.  Normal LV size and systolic function, EF 59%. 2.  Mildly dilated RV with normal systolic function, EF 63%. 3. No myocardial LGE, so no definitive evidence for prior myocardial infarction, myocarditis, or infiltrative disease.    10/28/2018: Coronary CT IMPRESSION: 1. Coronary artery calcium score 0 Agatston units, suggesting low risk for future cardiac events. 2.  No significant coronary disease.   08/01/2018: TTE Study Conclusions  - Left ventricle: The cavity size was normal. Wall thickness was   increased in a pattern of mild LVH. Systolic function was   moderately to severely reduced. The estimated ejection fraction   was in the range of 30% to 35%. Diffuse hypokinesis. Features are   consistent with a pseudonormal left ventricular filling pattern,   with concomitant abnormal relaxation and increased filling   pressure (grade 2 diastolic dysfunction). - Ventricular septum: Septal motion showed abnormal function and   dyssynergy. - Pulmonic valve: There was moderate regurgitation.   07/08/2018: LVEF < 20%    Recent Labs: No results found for requested labs within last 8760 hours.  No  results found for requested labs within last 8760 hours.   CrCl cannot be calculated (Patient's most recent lab result is older than the maximum 21 days allowed.).   Wt Readings from Last 3 Encounters:  02/20/21 (!) 317 lb 12.8 oz (144.2 kg)  08/27/19 280 lb (127 kg)  11/03/18 264 lb 12.8 oz (120.1 kg)     Other studies reviewed: Additional studies/records reviewed today include: summarized above  ASSESSMENT AND PLAN:  1. NICM     Recovery of her LVEF by her c.MRI in March 2020     No symptoms or exam findings to suggest volume OL     She has been out of her BB, Entresto, Eplerenone     (intolerant of spironolactone w/vaginal bleeding)  Will get a BMET today and in 2 weeks back on her meds. She is due for an echo though  would wait a few months back on her medicines  She has had a tubal ligation   2. HTN ?  Very high, ,annual by myself was the same  No symptoms   No meds for a month or so   Refills are being provided for her, she will resume today and is asked to keep an eye on her BP at home   3. Obesity     We re-discussed healy lifestyle, importance of diet, exercise, weight loss    The patient is without insurance and no money.  She states that she can not afford any kind of copay for any of the medicines. We do not have samples of her 97/103 Entresto, but do have 49/51mg  tabs here.  We will start with this BID and have given the pt paperwork for patient assistance and will send message to HF clinic pharmacist and social worker to reach out to the patient and get her back on line with them for help with her meds again.   Disposition: I don't think she needs EP necessarily, though will have her back in a couple weeks to follow up on her medicines/BP, otherwise she should see HF team in 4-5 mo to follow up on her echo and going forward, sooner if needed  Current medicines are reviewed at length with the patient today.  The patient did not have any concerns regarding  medicines.  Anniya, Whiters, PA-C 02/20/2021 10:54 AM     [facility] [address] [place] [address] [phone] (office)  [phone] (fax)

## 2021-02-20 ENCOUNTER — Other Ambulatory Visit (HOSPITAL_COMMUNITY): Payer: Self-pay

## 2021-02-20 ENCOUNTER — Other Ambulatory Visit: Payer: Self-pay

## 2021-02-20 ENCOUNTER — Ambulatory Visit (INDEPENDENT_AMBULATORY_CARE_PROVIDER_SITE_OTHER): Payer: Self-pay | Admitting: Physician Assistant

## 2021-02-20 ENCOUNTER — Encounter: Payer: Self-pay | Admitting: Physician Assistant

## 2021-02-20 ENCOUNTER — Telehealth (HOSPITAL_COMMUNITY): Payer: Self-pay | Admitting: Pharmacy Technician

## 2021-02-20 VITALS — BP 150/100 | HR 90 | Ht 66.0 in | Wt 317.8 lb

## 2021-02-20 DIAGNOSIS — Z79899 Other long term (current) drug therapy: Secondary | ICD-10-CM

## 2021-02-20 DIAGNOSIS — I428 Other cardiomyopathies: Secondary | ICD-10-CM

## 2021-02-20 DIAGNOSIS — I10 Essential (primary) hypertension: Secondary | ICD-10-CM

## 2021-02-20 LAB — BASIC METABOLIC PANEL
BUN/Creatinine Ratio: 7 — ABNORMAL LOW (ref 9–23)
BUN: 6 mg/dL (ref 6–20)
CO2: 25 mmol/L (ref 20–29)
Calcium: 9.4 mg/dL (ref 8.7–10.2)
Chloride: 99 mmol/L (ref 96–106)
Creatinine, Ser: 0.9 mg/dL (ref 0.57–1.00)
Glucose: 91 mg/dL (ref 65–99)
Potassium: 3.5 mmol/L (ref 3.5–5.2)
Sodium: 138 mmol/L (ref 134–144)
eGFR: 86 mL/min/{1.73_m2} (ref >59)

## 2021-02-20 MED ORDER — CARVEDILOL 25 MG PO TABS
25.0000 mg | ORAL_TABLET | Freq: Two times a day (BID) | ORAL | 3 refills | Status: DC
  Filled 2021-02-20: qty 60, 30d supply, fill #0

## 2021-02-20 MED ORDER — EPLERENONE 25 MG PO TABS
25.0000 mg | ORAL_TABLET | Freq: Every day | ORAL | 3 refills | Status: AC
  Filled 2021-02-20: qty 30, 30d supply, fill #0
  Filled 2021-04-20: qty 30, 30d supply, fill #1
  Filled 2021-05-23: qty 30, 30d supply, fill #2
  Filled 2021-07-13: qty 30, 30d supply, fill #3

## 2021-02-20 MED ORDER — ENTRESTO 49-51 MG PO TABS: 1.0000 | ORAL_TABLET | Freq: Two times a day (BID) | ORAL | 0 refills | Status: AC

## 2021-02-20 NOTE — Telephone Encounter (Signed)
Advanced Heart Failure Patient Advocate Encounter  Received a call that the patient has lost insurance and can no longer afford her medications.   Sent Heather Banker) a message to send coreg and inspra to [facility] to use the HF fund.  Started an Landscape architect for [employer] assistance. Will fax in once signatures are obtained.

## 2021-02-20 NOTE — Progress Notes (Signed)
Needs followup and needs help from social worker with meds.

## 2021-02-20 NOTE — Patient Instructions (Addendum)
Medication Instructions:   UNTIL RESUMED BACK ON ENTRESTO 97-103 TWICE A DAY    YOU WILL TAKE :  49/51 (SAMPLE)  ONE TABLET TWICE A DAY   Your physician recommends that you continue on your current medications as directed. Please refer to the Current Medication list given to you today.   *If you need a refill on your cardiac medications before your next appointment, please call your pharmacy*   Lab Work:  BMET TODAY.... AND RETURN FOR BMET IN 2 WEEKS   If you have labs (blood work) drawn today and your tests are completely normal, you will receive your results only by: MyChart Message (if you have MyChart) OR A paper copy in the mail If you have any lab test that is abnormal or we need to change your treatment, we will call you to review the results.   Testing/Procedures:  IN 3 MONTHS.Marland KitchenYour physician has requested that you have an echocardiogram. Echocardiography is a painless test that uses sound waves to create images of your heart. It provides your doctor with information about the size and shape of your heart and how well your heart's chambers and valves are working. This procedure takes approximately one hour. There are no restrictions for this procedure.   Follow-Up: At [facility], you and your health needs are our priority.  As part of our continuing mission to provide you with exceptional heart care, we have created designated Provider Care Teams.  These Care Teams include your primary Cardiologist (physician) and Advanced Practice Providers (APPs -  Physician Assistants and Nurse Practitioners) who all work together to provide you with the care you need, when you need it.  We recommend signing up for the patient portal called "MyChart".  Sign up information is provided on this After Visit Summary.  MyChart is used to connect with patients for Virtual Visits (Telemedicine).  Patients are able to view lab/test results, encounter notes, upcoming appointments, etc.  Non-urgent  messages can be sent to your provider as well.   To learn more about what you can do with MyChart, go to [email or web address].    Your next appointment:   4-5  month(s)  The format for your next appointment:   In Person  Provider:   Dr Shirlee Latch or APP in 4 to 5 months   Alejandro Mulling PA-C in 2-3 weeks    Other Instructions

## 2021-03-02 NOTE — Telephone Encounter (Signed)
Advanced Heart Failure Patient Advocate Encounter  Submitted Patient Assistance Application to [employer] for  Entresto  along with provider portion. Will update patient when we receive a response.  Fax# [phone] Phone# [phone]  

## 2021-03-14 NOTE — Telephone Encounter (Signed)
Advanced Heart Failure Patient Advocate Encounter   Patient was approved to receive Entresto from [employer]  Patient ID: [id number] Effective dates: 03/03/21 through 03/03/22

## 2021-03-20 ENCOUNTER — Other Ambulatory Visit: Payer: Self-pay

## 2021-03-20 ENCOUNTER — Encounter: Payer: Self-pay | Admitting: Student

## 2021-03-20 ENCOUNTER — Ambulatory Visit (INDEPENDENT_AMBULATORY_CARE_PROVIDER_SITE_OTHER): Payer: Self-pay | Admitting: Student

## 2021-03-20 VITALS — BP 106/72 | HR 77 | Ht 66.0 in | Wt 313.2 lb

## 2021-03-20 DIAGNOSIS — I428 Other cardiomyopathies: Secondary | ICD-10-CM

## 2021-03-20 DIAGNOSIS — I10 Essential (primary) hypertension: Secondary | ICD-10-CM

## 2021-03-20 NOTE — Patient Instructions (Signed)
Medication Instructions:  Your physician recommends that you continue on your current medications as directed. Please refer to the Current Medication list given to you today.  *If you need a refill on your cardiac medications before your next appointment, please call your pharmacy*   Lab Work: TODAY: BMET  If you have labs (blood work) drawn today and your tests are completely normal, you will receive your results only by: MyChart Message (if you have MyChart) OR A paper copy in the mail If you have any lab test that is abnormal or we need to change your treatment, we will call you to review the results.    Follow-Up: At [facility], you and your health needs are our priority.  As part of our continuing mission to provide you with exceptional heart care, we have created designated Provider Care Teams.  These Care Teams include your primary Cardiologist (physician) and Advanced Practice Providers (APPs -  Physician Assistants and Nurse Practitioners) who all work together to provide you with the care you need, when you need it.   Your next appointment:   As Needed

## 2021-03-20 NOTE — Progress Notes (Signed)
PCP:  Patient, [name] (Inactive) Primary Cardiologist: Dr. Shirlee Latch Electrophysiologist: Dr. Graciela Husbands ( No on-going EP issues)  Joy Diaz is a 35 y.o. female seen today for Dr. Graciela Husbands for routine electrophysiology followup.  Since last being seen in our clinic the patient reports doing better and back on her medications. She is watching what she eats and trying to do less take out (roommates constantly order). She is out of school currently but getting ready to start back up studying cyber security. No SOB/DOE with ADLs.   Past Medical History:  Diagnosis Date   Anxiety    Enlarged heart    GERD (gastroesophageal reflux disease)    Hypertensive urgency    Obese    Sinusitis    Past Surgical History:  Procedure Laterality Date   MANDIBLE SURGERY Bilateral 2006   TUBAL LIGATION Bilateral 2015    Current Outpatient Medications  Medication Sig Dispense Refill   carvedilol (COREG) 25 MG tablet Take 1 tablet (25 mg total) by mouth 2 (two) times daily with a meal.  *HF FUND 60 tablet 3   cetirizine (ZYRTEC) 5 MG tablet Take 5 mg by mouth daily.     eplerenone (INSPRA) 25 MG tablet Take 1 tablet (25 mg total) by mouth daily. 30 tablet 3   fluticasone (FLONASE) 50 MCG/ACT nasal spray Place 2 sprays into both nostrils daily.     omeprazole (PRILOSEC) 10 MG capsule Take 40 mg by mouth daily.      sacubitril-valsartan (ENTRESTO) 49-51 MG Take 1 tablet by mouth 2 (two) times daily. UNTIL RESUMES BACK 97-103 TWICE A DAY 28 tablet 0   No current facility-administered medications for this visit.    Allergies  Allergen Reactions   Spironolactone Other (See Comments)    Vaginal bleeding    Social History   Socioeconomic History   Marital status: Single    Spouse name: Not on file   Number of children: Not on file   Years of education: Not on file   Highest education level: Not on file  Occupational History   Not on file  Tobacco Use   Smoking status: Never   Smokeless tobacco:  Never  Vaping Use   Vaping Use: Never used  Substance and Sexual Activity   Alcohol use: Never   Drug use: Never   Sexual activity: Not on file  Other Topics Concern   Not on file  Social History Narrative   Not on file   Social Determinants of Health   Financial Resource Strain: Not on file  Food Insecurity: Not on file  Transportation Needs: Not on file  Physical Activity: Not on file  Stress: Not on file  Social Connections: Not on file  Intimate Partner Violence: Not on file     Review of Systems: General: No chills, fever, night sweats or weight changes  Cardiovascular:  No chest pain, dyspnea on exertion, edema, orthopnea, palpitations, paroxysmal nocturnal dyspnea Dermatological: No rash, lesions or masses Respiratory: No cough, dyspnea Urologic: No hematuria, dysuria Abdominal: No nausea, vomiting, diarrhea, bright red blood per rectum, melena, or hematemesis Neurologic: No visual changes, weakness, changes in mental status All other systems reviewed and are otherwise negative except as noted above.  Physical Exam: Vitals:   03/20/21 1231  BP: 106/72  Pulse: 77  SpO2: 99%  Weight: (!) 313 lb 3.2 oz (142.1 kg)  Height: 5\' 6"  (1.676 m)   Wt Readings from Last 3 Encounters:  03/20/21 (!) 313 lb 3.2 oz (  142.1 kg)  02/20/21 (!) 317 lb 12.8 oz (144.2 kg)  08/27/19 280 lb (127 kg)     GEN- The patient is well appearing, alert and oriented x 3 today.   HEENT: normocephalic, atraumatic; sclera clear, conjunctiva pink; hearing intact; oropharynx clear; neck supple, no JVP Lymph- no cervical lymphadenopathy Lungs- Clear to ausculation bilaterally, normal work of breathing.  No wheezes, rales, rhonchi Heart- Regular rate and rhythm, no murmurs, rubs or gallops, PMI not laterally displaced GI- soft, non-tender, non-distended, bowel sounds present, no hepatosplenomegaly Extremities- no clubbing, cyanosis, or edema; DP/PT/radial pulses 2+ bilaterally MS- no  significant deformity or atrophy Skin- warm and dry, no rash or lesion Psych- euthymic mood, full affect Neuro- strength and sensation are intact  EKG is not ordered.   Additional studies reviewed include: Previous EP and HF notes  Assessment and Plan:  1. NICM     Recovery of her LVEF by her c.MRI in March 2020, though went for a period without meds.  Class II symptoms and volume status stable on exma Continue coreg 25 mg BID, Entresto 49/51 mg BID ( no room to increase), and Eplerenone 25 mg daily.  BMET today.  Has Echo ordered for September after a period of being back on medications.    2. HTN  Much improved back on medications.    3. Obesity Body mass index is 50.55 kg/m.  Encouraged continued lifestyle modification.   She has been placed back on the radar of HF clinic who providers excellent assistance of patients in her situation. She has been approved for [facility] assistance and is awaiting instructions on getting first fill.    Disposition: Follow up with EP as needed for now to save co-pays. She has no on-going EP needs and is not currently an ICD candidate with most recent EF having resolved. Can f/u further as needed post Echo in September.    BMET today. Entresto samples to bridge gap of waiting for Assistance meds to arrive. (Will confirm process with HF pharmacist)   Signed, Graciella Freer, PA-C  03/20/21 12:44 PM

## 2021-03-21 LAB — BASIC METABOLIC PANEL
BUN/Creatinine Ratio: 7 — ABNORMAL LOW (ref 9–23)
BUN: 6 mg/dL (ref 6–20)
CO2: 23 mmol/L (ref 20–29)
Calcium: 9.3 mg/dL (ref 8.7–10.2)
Chloride: 102 mmol/L (ref 96–106)
Creatinine, Ser: 0.82 mg/dL (ref 0.57–1.00)
Glucose: 112 mg/dL — ABNORMAL HIGH (ref 65–99)
Potassium: 3.7 mmol/L (ref 3.5–5.2)
Sodium: 137 mmol/L (ref 134–144)
eGFR: 96 mL/min/{1.73_m2} (ref >59)

## 2021-04-15 ENCOUNTER — Encounter (HOSPITAL_COMMUNITY): Payer: Self-pay

## 2021-04-20 ENCOUNTER — Other Ambulatory Visit (HOSPITAL_BASED_OUTPATIENT_CLINIC_OR_DEPARTMENT_OTHER): Payer: Self-pay

## 2021-04-27 ENCOUNTER — Telehealth (HOSPITAL_COMMUNITY): Payer: Self-pay | Admitting: *Deleted
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
## 2021-05-11 ENCOUNTER — Ambulatory Visit (HOSPITAL_COMMUNITY)
Admission: RE | Admit: 2021-05-11 | Discharge: 2021-05-11 | Disposition: A | Discharge status: Home / Self Care | Payer: Self-pay | Source: Ambulatory Visit | Attending: Cardiology | Admitting: Cardiology

## 2021-05-11 ENCOUNTER — Other Ambulatory Visit: Payer: Self-pay

## 2021-05-11 ENCOUNTER — Encounter (HOSPITAL_COMMUNITY): Payer: Self-pay | Admitting: Cardiology

## 2021-05-11 VITALS — BP 128/80 | HR 88 | Wt 309.2 lb

## 2021-05-11 DIAGNOSIS — I428 Other cardiomyopathies: Secondary | ICD-10-CM | POA: Insufficient documentation

## 2021-05-11 DIAGNOSIS — E669 Obesity, unspecified: Secondary | ICD-10-CM | POA: Insufficient documentation

## 2021-05-11 DIAGNOSIS — I110 Hypertensive heart disease with heart failure: Secondary | ICD-10-CM | POA: Insufficient documentation

## 2021-05-11 DIAGNOSIS — Z8249 Family history of ischemic heart disease and other diseases of the circulatory system: Secondary | ICD-10-CM | POA: Insufficient documentation

## 2021-05-11 DIAGNOSIS — Z6841 Body Mass Index (BMI) 40.0 and over, adult: Secondary | ICD-10-CM | POA: Insufficient documentation

## 2021-05-11 DIAGNOSIS — I5022 Chronic systolic (congestive) heart failure: Secondary | ICD-10-CM | POA: Insufficient documentation

## 2021-05-11 DIAGNOSIS — I5032 Chronic diastolic (congestive) heart failure: Secondary | ICD-10-CM

## 2021-05-11 DIAGNOSIS — Z79899 Other long term (current) drug therapy: Secondary | ICD-10-CM | POA: Insufficient documentation

## 2021-05-11 LAB — BASIC METABOLIC PANEL
Anion gap: 7 (ref 5–15)
BUN: 9 mg/dL (ref 6–20)
CO2: 25 mmol/L (ref 22–32)
Calcium: 9.1 mg/dL (ref 8.9–10.3)
Chloride: 104 mmol/L (ref 98–111)
Creatinine, Ser: 0.9 mg/dL (ref 0.44–1.00)
GFR, Estimated: >60 mL/min (ref >60)
Glucose, Bld: 98 mg/dL (ref 70–99)
Potassium: 3.9 mmol/L (ref 3.5–5.1)
Sodium: 136 mmol/L (ref 135–145)

## 2021-05-11 LAB — BRAIN NATRIURETIC PEPTIDE: B Natriuretic Peptide: 16.9 pg/mL (ref 0.0–100.0)

## 2021-05-11 NOTE — Progress Notes (Signed)
Date:  05/11/2021   ID:  [name], DOB 1986-06-05, MRN [id number]  Provider location: [place] Advanced [facility] Failure Type of Visit: Established patient   PCP:  Patient, [name] (Inactive)  Cardiologist:  Dr. Shirlee Latch   History of Present Illness: Joy Diaz is a 35 y.o. female with cardiomyopathy initially referred by Dr. Graciela Husbands for evaluation of CHF.   In 10/19, she developed dyspnea.  She thought she had another episode of sinusitis initially (has had episodes repetitively in the past).  She had congestion, cough, and dyspnea. No chest pain. She had a CXR done that showed cardiomegaly.  Therefore, in 11/19, she had an echo done in [place] showing EF 15%, mild LV dilation, normal RV.  She was started on cardiac medications with cardiomyopathy.  She had vaginal bleeding twice with spironolactone so stopped it.  She is tolerating Coreg and Entresto.  Repeat echo in 12/19 showed EF 30-35%, mild LVH.     In 3/20, she had a coronary CTA showing no significant coronary disease.  She also had a cardiac MRI. This showed normalization of LVEF at 59% and no delayed enhancement.    She returns to this clinic after a long hiatus.  She denies dyspnea with usual ADLs.  No problems walking up a flight of stairs.  She gets winded walking a long distance.  No orthopnea/PND.  No lightheadedness.  No chest pain.  She is going to school for cyber security.  Weight has been trending up.   ECG (personally reviewed): NSR, normal   Labs (12/19): K 4.4, creatinine 1.44 Labs (1/20): K 3.8, creatinine 0.97 Labs (3/20): K 3.4, creatinine 0.96 Labs (7/22): K 3.7, creatinine 0.82   PMH: 1. Obesity 2. Bipolar disorder 3. Recurrent sinusitis 4. HTN: Also with pre-eclampsia history.  5. Chronic systolic CHF:  - Echo (11/19, [place]): EF 15%, mild LV dilation, severe LAE, normal RV size and systolic function.  - Echo (12/19): EF 30-35%, mild LVH, moderate diastolic dysfunction.  - Coronary CTA (3/20):  Coronary calcium score 0, no significant coronary disease.  - Cardiac MRI (3/20): LVEF 59%, RVEF 63%, no LGE.   Current Outpatient Medications  Medication Sig Dispense Refill   carvedilol (COREG) 25 MG tablet Take 1 tablet (25 mg total) by mouth 2 (two) times daily with a meal.  *HF FUND 60 tablet 3   cetirizine (ZYRTEC) 5 MG tablet Take 5 mg by mouth daily.     eplerenone (INSPRA) 25 MG tablet Take 1 tablet (25 mg total) by mouth daily. 30 tablet 3   fluticasone (FLONASE) 50 MCG/ACT nasal spray Place 2 sprays into both nostrils daily.     omeprazole (PRILOSEC) 10 MG capsule Take 40 mg by mouth daily.      sacubitril-valsartan (ENTRESTO) 49-51 MG Take 1 tablet by mouth 2 (two) times daily. UNTIL RESUMES BACK 97-103 TWICE A DAY 28 tablet 0   No current facility-administered medications for this encounter.    Allergies:   Spironolactone   Social History:  The patient  reports that she has never smoked. She has never used smokeless tobacco. She reports that she does not drink alcohol and does not use drugs.   Family History:  The patient's family history includes Asthma in her mother; CAD in her paternal grandfather; Celiac disease in her maternal grandmother; Heart disease in her maternal grandfather; Irritable bowel syndrome in her brother; Migraines in her mother; Osteopenia in her maternal grandmother; Rheum arthritis in her mother; Ulcerative  colitis in her sister.   ROS:  Please see the history of present illness.   All other systems are personally reviewed and negative.   Exam:   BP 128/80   Pulse 88   Wt (!) 140.3 kg (309 lb 3.2 oz)   SpO2 97%   BMI 49.91 kg/m  General: NAD, obesity Neck: No JVD, no thyromegaly or thyroid nodule.  Lungs: Clear to auscultation bilaterally with normal respiratory effort. CV: Nondisplaced PMI.  Heart regular S1/S2, no S3/S4, no murmur.  No peripheral edema.  No carotid bruit.  Normal pedal pulses.  Abdomen: Soft, nontender, no hepatosplenomegaly,  no distention.  Skin: Intact without lesions or rashes.  Neurologic: Alert and oriented x 3.  Psych: Normal affect. Extremities: No clubbing or cyanosis.  HEENT: Normal.   Recent Labs: 05/11/2021: B Natriuretic Peptide 16.9; BUN 9; Creatinine, Ser 0.90; Potassium 3.9; Sodium 136  Personally reviewed   Wt Readings from Last 3 Encounters:  05/11/21 (!) 140.3 kg (309 lb 3.2 oz)  03/20/21 (!) 142.1 kg (313 lb 3.2 oz)  02/20/21 (!) 144.2 kg (317 lb 12.8 oz)      ASSESSMENT AND PLAN:  1. Chronic systolic CHF: Nonischemic cardiomyopathy.  Echo in 10/19 with EF 15%, improved to 30-35% on echo in 12/19. No chest pain.  It is possible that her cardiomyopathy is due to viral myocarditis, especially given onset that seemed like a sinusitis episode. No strong family history of cardiomyopathy, no heavy ETOH/drugs. Coronary CTA in 3/20 showed no coronary disease.  Cardiac MRI in 3/20 showed improvement in LVEF back to 59%.   On exam, she is not volume overloaded.  NYHA class II symptoms.   - I will arrange for repeat echo to make sure that EF remains normal.  - Continue Coreg 25 mg bid.  - Continue Entresto 49/51 bid.  - Continue eplerenone 25 daily.   - BMET/BNP today.  After that, she will need a BMET at least every 3 months.   2. HTN: BP has been controlled.  3. Obesity: Weight continues to rise.  - I will refer her to pharmacy clinic to see if she can get semaglutide.   Followup 1 year  Signed, Marca Ancona, MD  05/11/2021  [facility] [address] [facility] [place] [address] [phone] (office) [phone] (fax)

## 2021-05-11 NOTE — Patient Instructions (Addendum)
You have been referred to social work.  You have been provided forms for financial assistance and insurance.  Eileen Stanford the social worker will contact you to discuss in detail  Your physician has requested that you have an echocardiogram. Echocardiography is a painless test that uses sound waves to create images of your heart. It provides your doctor with information about the size and shape of your heart and how well your heart's chambers and valves are working. This procedure takes approximately one hour. There are no restrictions for this procedure.  Labs today and every 3 months We will only contact you if something comes back abnormal or we need to make some changes. Otherwise no news is good news!  Your physician recommends that you schedule a follow-up appointment in: 1 year with Dr Shirlee Latch.  Our office will contact you closer to that time to schedule this appointment  Please call office at [phone] option 2 if you have any questions or concerns.   At the [facility], you and your health needs are our priority. As part of our continuing mission to provide you with exceptional heart care, we have created designated Provider Care Teams. These Care Teams include your primary Cardiologist (physician) and Advanced Practice Providers (APPs- Physician Assistants and Nurse Practitioners) who all work together to provide you with the care you need, when you need it.   You may see any of the following providers on your designated Care Team at your next follow up: Dr Arvilla Meres Dr Marca Ancona Dr Brandon Melnick, NP Robbie Lis, [place] Mikki Santee Karle Plumber, PharmD   Please be sure to bring in all your medications bottles to every appointment.

## 2021-05-12 ENCOUNTER — Telehealth (HOSPITAL_COMMUNITY): Payer: Self-pay | Admitting: Licensed Clinical Social Worker

## 2021-05-12 NOTE — Telephone Encounter (Signed)
CSW attempted to follow up with pt regarding insurance and financial concerns- unable to reach and unable to leave VM- will continue to reach out   Burna Sis, LCSW Clinical Social Worker [facility] Desk#: [phone] Cell#: [phone]

## 2021-05-16 ENCOUNTER — Telehealth: Payer: Self-pay | Admitting: Student-PharmD

## 2021-05-16 ENCOUNTER — Other Ambulatory Visit (HOSPITAL_COMMUNITY): Payer: Self-pay | Admitting: *Deleted

## 2021-05-16 MED ORDER — CARVEDILOL 25 MG PO TABS: 25.0000 mg | ORAL_TABLET | Freq: Two times a day (BID) | ORAL | 3 refills | Status: DC

## 2021-05-16 NOTE — Telephone Encounter (Signed)
-----   Message from Awilda Metro, RPH-CPP sent at 05/11/2021  1:53 PM EDT -----  ----- Message ----- From: Sharol Roussel, RN Sent: 05/11/2021  12:13 PM EDT To: Awilda Metro, RPH-CPP  Hi Megan,  Dr Shirlee Latch would like to refer this patient for Semaglutide  Thank you  [facility]

## 2021-05-16 NOTE — Telephone Encounter (Signed)
Received referral from Dr. Shirlee Latch to look into starting semaglutide for this patient. Per chart review, patient is currently uninsured. Out of pocket costs of Ozempic or Wegovy are >$1400 per month. She does not have a diagnosis of diabetes to apply for patient assistance. Called the patient to let her know of this and confirmed that she does not currently have insurance and does not have diabetes. She will let us know if she gets insurance again in the future and we could revisit starting GLP1 treatment. She thanked me for the call.

## 2021-05-19 ENCOUNTER — Telehealth (HOSPITAL_COMMUNITY): Payer: Self-pay | Admitting: Licensed Clinical Social Worker

## 2021-05-19 NOTE — Progress Notes (Signed)
  Heart and Vascular Care Navigation  05/19/2021  Joy Diaz 05-01-1986 [id number]  Reason for Referral: financial assistance    Engaged with patient by telephone for initial visit for Heart and Vascular Care Coordination.                                                                                                   Assessment:   Pt reports that her main concern at this time is hospital bills.  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  Stays with family who cover her housing expenses and gets food stamps to pay for food but has no income to pay for extraneous costs.  Emailed pt CAFA application and instructions                                  HRT/VAS Care Coordination     None       Social History:                                                                             SDOH Screenings   Alcohol Screen: Not on file  Depression (PHQ2-9): Not on file  Financial Resource Strain: High Risk   Difficulty of Paying Living Expenses: Very hard  Food Insecurity: No Food Insecurity   Worried About Programme researcher, broadcasting/film/video in the Last Year: Never true   Ran Out of Food in the Last Year: Never true  Housing: Low Risk    Last Housing Risk Score: 0  Physical Activity: Not on file  Social Connections: Not on file  Stress: Not on file  Tobacco Use: Low Risk    Smoking Tobacco Use: Never   Smokeless Tobacco Use: Never  Transportation Needs: Not on file    SDOH Interventions: Financial Resources:  Corporate treasurer Interventions: Artist, Other (Comment) (CAFA) Surveyor, quantity Counseling for [employer] Program  Food Insecurity:  Food Insecurity Interventions: Intervention Not Indicated  Housing Insecurity:  Housing Interventions: Intervention Not Indicated  Transportation:    No issues reported   Follow-up plan:    Pt to complete CAFA and turn in for review.  Will continue to follow and assist as needed  Burna Sis,  LCSW Clinical Social Worker [facility] Desk#: [phone] Cell#: [phone]

## 2021-05-22 ENCOUNTER — Other Ambulatory Visit (HOSPITAL_COMMUNITY): Payer: BC Managed Care – PPO

## 2021-05-23 ENCOUNTER — Other Ambulatory Visit (HOSPITAL_COMMUNITY): Payer: Self-pay

## 2021-05-23 ENCOUNTER — Other Ambulatory Visit (HOSPITAL_BASED_OUTPATIENT_CLINIC_OR_DEPARTMENT_OTHER): Payer: Self-pay

## 2021-06-06 ENCOUNTER — Encounter (HOSPITAL_COMMUNITY): Payer: Self-pay

## 2021-06-06 ENCOUNTER — Ambulatory Visit (HOSPITAL_COMMUNITY): Payer: Self-pay | Attending: Cardiovascular Disease

## 2021-06-06 NOTE — Progress Notes (Signed)
Verified appointment "no show" status with G. Andrews at 12:57.  

## 2021-06-08 ENCOUNTER — Other Ambulatory Visit (HOSPITAL_COMMUNITY): Payer: Self-pay

## 2021-06-13 ENCOUNTER — Encounter (HOSPITAL_COMMUNITY): Payer: Self-pay

## 2021-06-13 ENCOUNTER — Ambulatory Visit (HOSPITAL_COMMUNITY): Payer: Self-pay | Attending: Physician Assistant

## 2021-06-13 ENCOUNTER — Encounter (HOSPITAL_COMMUNITY): Payer: Self-pay | Admitting: Physician Assistant

## 2021-06-27 ENCOUNTER — Telehealth (HOSPITAL_COMMUNITY): Payer: Self-pay | Admitting: Physician Assistant

## 2021-06-27 NOTE — Telephone Encounter (Signed)
Just an FYI. We have made several attempts to contact this patient including sending a letter to schedule or reschedule their echocardiogram. We will be removing the patient from the echo WQ.   06/13/21 NO SHOWED x2 -MAILED LETTER- [facility]  PT cx'd 9/26-NS"D 10/11, cx;d 06/08/21 and NO SHOWED 06/13/21      Thank you

## 2021-07-13 ENCOUNTER — Other Ambulatory Visit (HOSPITAL_COMMUNITY): Payer: Self-pay

## 2021-08-10 ENCOUNTER — Other Ambulatory Visit (HOSPITAL_COMMUNITY): Payer: Self-pay

## 2021-12-09 ENCOUNTER — Emergency Department (HOSPITAL_COMMUNITY)
Admission: EM | Admit: 2021-12-09 | Discharge: 2021-12-10 | Disposition: A | Discharge status: Home / Self Care | Payer: Self-pay | Attending: Emergency Medicine | Admitting: Emergency Medicine

## 2021-12-09 ENCOUNTER — Other Ambulatory Visit: Payer: Self-pay

## 2021-12-09 ENCOUNTER — Encounter (HOSPITAL_COMMUNITY): Payer: Self-pay

## 2021-12-09 ENCOUNTER — Emergency Department (HOSPITAL_COMMUNITY): Payer: Self-pay

## 2021-12-09 DIAGNOSIS — Z5321 Procedure and treatment not carried out due to patient leaving prior to being seen by health care provider: Secondary | ICD-10-CM | POA: Insufficient documentation

## 2021-12-09 DIAGNOSIS — R072 Precordial pain: Secondary | ICD-10-CM | POA: Insufficient documentation

## 2021-12-09 DIAGNOSIS — Y92410 Unspecified street and highway as the place of occurrence of the external cause: Secondary | ICD-10-CM | POA: Insufficient documentation

## 2021-12-09 DIAGNOSIS — M25531 Pain in right wrist: Secondary | ICD-10-CM | POA: Diagnosis not present
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
## 2021-12-10 NOTE — ED Notes (Signed)
Pt left due to not being seen quick enough 

## 2022-04-23 ENCOUNTER — Other Ambulatory Visit (HOSPITAL_COMMUNITY): Payer: Self-pay

## 2022-04-23 ENCOUNTER — Ambulatory Visit (INDEPENDENT_AMBULATORY_CARE_PROVIDER_SITE_OTHER): Payer: Self-pay

## 2022-04-23 ENCOUNTER — Ambulatory Visit (HOSPITAL_COMMUNITY)
Admission: EM | Admit: 2022-04-23 | Discharge: 2022-04-23 | Disposition: A | Discharge status: Home / Self Care | Payer: Self-pay | Attending: Internal Medicine | Admitting: Internal Medicine

## 2022-04-23 ENCOUNTER — Encounter (HOSPITAL_COMMUNITY): Payer: Self-pay

## 2022-04-23 DIAGNOSIS — M79605 Pain in left leg: Secondary | ICD-10-CM

## 2022-04-23 DIAGNOSIS — M25562 Pain in left knee: Secondary | ICD-10-CM

## 2022-04-23 MED ORDER — CEFTRIAXONE SODIUM 500 MG IJ SOLR
500.0000 mg | Freq: Once | INTRAMUSCULAR | Status: DC
Start: 2022-04-23 — End: 2022-04-23

## 2022-04-23 NOTE — ED Triage Notes (Signed)
Pt tripped on a hole in the ground x 1 week ago and fell. Pt is having worsening pain and swelling in her left lower leg.

## 2022-04-23 NOTE — Discharge Instructions (Addendum)
Recommend continued ice, elevation, ibuprofen or tylenol as needed.  Weight bear as tolerated

## 2022-04-23 NOTE — ED Provider Notes (Signed)
[facility]    CSN: [id number] Arrival date & time: 04/23/22  1631      History   Chief Complaint Chief Complaint  Patient presents with   Fall    HPI Joy Diaz is a 36 y.o. female.   Pt complains of right lower leg pain that started about one week ago after she tripped in a hole and fell landing on her right knee.  She reports swelling to the left lower leg and mild bruising since then.  She has applied ice, elevated, and taken ibuprofen with minimal relief.  No other injuries. Pt reports walking makes the pain worse.     Past Medical History:  Diagnosis Date   Anxiety    Enlarged heart    GERD (gastroesophageal reflux disease)    Hypertensive urgency    Obese    Sinusitis     There are no problems to display for this patient.   Past Surgical History:  Procedure Laterality Date   MANDIBLE SURGERY Bilateral 2006   TUBAL LIGATION Bilateral 2015    OB History   No obstetric history on file.      Home Medications    Prior to Admission medications   Medication Sig Start Date End Date Taking? Authorizing Provider  carvedilol (COREG) 25 MG tablet Take 1 tablet (25 mg total) by mouth 2 (two) times daily with a meal.  *HF FUND 05/16/21   Laurey Morale, MD  cetirizine (ZYRTEC) 5 MG tablet Take 5 mg by mouth daily.    [provider]  eplerenone (INSPRA) 25 MG tablet Take 1 tablet (25 mg total) by mouth daily. 02/20/21   Laurey Morale, MD  fluticasone [facility]) 50 MCG/ACT nasal spray Place 2 sprays into both nostrils daily. 05/21/19   [provider]  omeprazole (PRILOSEC) 10 MG capsule Take 40 mg by mouth daily.     [provider]  sacubitril-valsartan (ENTRESTO) 49-51 MG Take 1 tablet by mouth 2 (two) times daily. UNTIL RESUMES BACK 97-103 TWICE A DAY 02/20/21   Sheilah Pigeon, PA-C    Family History Family History  Problem Relation Age of Onset   CAD Paternal Grandfather    Asthma Mother    Migraines Mother     Rheum arthritis Mother    Ulcerative colitis Sister    Irritable bowel syndrome Brother    Celiac disease Maternal Grandmother    Osteopenia Maternal Grandmother    Heart disease Maternal Grandfather     Social History Social History   Tobacco Use   Smoking status: Never   Smokeless tobacco: Never  Vaping Use   Vaping Use: Never used  Substance Use Topics   Alcohol use: Never   Drug use: Never     Allergies   Spironolactone   Review of Systems Review of Systems  Constitutional:  Negative for chills and fever.  HENT:  Negative for ear pain and sore throat.   Eyes:  Negative for pain and visual disturbance.  Respiratory:  Negative for cough and shortness of breath.   Cardiovascular:  Negative for chest pain and palpitations.  Gastrointestinal:  Negative for abdominal pain and vomiting.  Genitourinary:  Negative for dysuria and hematuria.  Musculoskeletal:  Positive for arthralgias (left lower leg pain). Negative for back pain.  Skin:  Negative for color change and rash.  Neurological:  Negative for seizures and syncope.  All other systems reviewed and are negative.    Physical Exam Triage Vital Signs ED Triage  Vitals  Enc Vitals Group     BP 04/23/22 1723 (!) 180/113     Pulse Rate 04/23/22 1723 78     Resp 04/23/22 1723 18     Temp 04/23/22 1723 98.7 F (37.1 C)     Temp src --      SpO2 04/23/22 1723 99 %     Weight --      Height --      Head Circumference --      Peak Flow --      Pain Score 04/23/22 1721 8     Pain Loc --      Pain Edu? --      Excl. in GC? --    No data found.  Updated Vital Signs BP (!) 180/113   Pulse 78   Temp 98.7 F (37.1 C)   Resp 18   LMP 04/02/2022   SpO2 99%   Visual Acuity Right Eye Distance:   Left Eye Distance:   Bilateral Distance:    Right Eye Near:   Left Eye Near:    Bilateral Near:     Physical Exam Vitals and nursing note reviewed.  Constitutional:      General: She is not in acute distress.     Appearance: She is well-developed.  HENT:     Head: Normocephalic and atraumatic.  Eyes:     Conjunctiva/sclera: Conjunctivae normal.  Cardiovascular:     Rate and Rhythm: Normal rate and regular rhythm.     Heart sounds: No murmur heard. Pulmonary:     Effort: Pulmonary effort is normal. No respiratory distress.     Breath sounds: Normal breath sounds.  Abdominal:     Palpations: Abdomen is soft.     Tenderness: There is no abdominal tenderness.  Musculoskeletal:        General: No swelling.     Cervical back: Neck supple.       Legs:  Skin:    General: Skin is warm and dry.     Capillary Refill: Capillary refill takes less than 2 seconds.  Neurological:     Mental Status: She is alert.  Psychiatric:        Mood and Affect: Mood normal.      UC Treatments / Results  Labs (all labs ordered are listed, but only abnormal results are displayed) Labs Reviewed - No data to display  EKG   Radiology DG Tibia/Fibula Left  Result Date: 04/23/2022 CLINICAL DATA:  Swelling EXAM: LEFT TIBIA AND FIBULA - 2 VIEW COMPARISON:  None Available. FINDINGS: There is no evidence of fracture or other focal bone lesions. Soft tissues are unremarkable. IMPRESSION: Negative. Electronically Signed   By: Emmaline Kluver M.D.   On: 04/23/2022 18:14    Procedures Procedures (including critical care time)  Medications Ordered in UC Medications - No data to display  Initial Impression / Assessment and Plan / UC Course  I have reviewed the triage vital signs and the nursing notes.  Pertinent labs & imaging results that were available during my care of the patient were reviewed by me and considered in my medical decision making (see chart for details).     Xray negative for fracture.  Supportive care discussed, weight bear as tolerated.  Final Clinical Impressions(s) / UC Diagnoses   Final diagnoses:  Arthralgia of left lower leg     Discharge Instructions      Recommend continued  ice, elevation, ibuprofen or tylenol as needed.  Weight  bear as tolerated      ED Prescriptions   None    PDMP not reviewed this encounter.   Ward, Tylene Fantasia, PA-C 04/23/22 1836

## 2022-06-13 ENCOUNTER — Other Ambulatory Visit (HOSPITAL_COMMUNITY): Payer: Self-pay | Admitting: Cardiology
